# Patient Record
Sex: Male | Born: 1990
Health system: Southern US, Community
[De-identification: ages and names within clinical notes are randomized; demographics above are authoritative.]

## PROBLEM LIST (undated history)

## (undated) DIAGNOSIS — F419 Anxiety disorder, unspecified: Secondary | ICD-10-CM

## (undated) HISTORY — PX: MOUTH SURGERY: SHX715

## (undated) HISTORY — PX: HAND SURGERY: SHX662

## (undated) HISTORY — PX: JOINT REPLACEMENT: SHX530

---

## 2011-04-09 ENCOUNTER — Emergency Department (HOSPITAL_BASED_OUTPATIENT_CLINIC_OR_DEPARTMENT_OTHER)
Admission: EM | Admit: 2011-04-09 | Discharge: 2011-04-09 | Disposition: A | Discharge status: Home / Self Care | Payer: Self-pay | Attending: Emergency Medicine | Admitting: Emergency Medicine

## 2011-04-09 ENCOUNTER — Encounter: Payer: Self-pay | Admitting: *Deleted

## 2011-04-09 DIAGNOSIS — F17200 Nicotine dependence, unspecified, uncomplicated: Secondary | ICD-10-CM | POA: Insufficient documentation

## 2011-04-09 DIAGNOSIS — K029 Dental caries, unspecified: Secondary | ICD-10-CM | POA: Insufficient documentation

## 2011-04-09 DIAGNOSIS — J45909 Unspecified asthma, uncomplicated: Secondary | ICD-10-CM | POA: Insufficient documentation

## 2011-04-09 MED ORDER — CLINDAMYCIN HCL 150 MG PO CAPS
150.0000 mg | ORAL_CAPSULE | Freq: Once | ORAL | Status: AC
  Administered 2011-04-09: 150 mg via ORAL
  Filled 2011-04-09: qty 1

## 2011-04-09 MED ORDER — CEFTRIAXONE SODIUM 1 G IJ SOLR
1.0000 g | Freq: Once | INTRAMUSCULAR | Status: AC
  Administered 2011-04-09: 1 g via INTRAMUSCULAR
  Filled 2011-04-09: qty 10

## 2011-04-09 MED ORDER — OXYCODONE-ACETAMINOPHEN 5-325 MG PO TABS: 2.0000 | ORAL_TABLET | ORAL | Status: AC | PRN

## 2011-04-09 MED ORDER — OXYCODONE-ACETAMINOPHEN 5-325 MG PO TABS
2.0000 | ORAL_TABLET | Freq: Once | ORAL | Status: AC
  Administered 2011-04-09: 2 via ORAL
  Filled 2011-04-09: qty 2

## 2011-04-09 MED ORDER — CLINDAMYCIN HCL 150 MG PO CAPS: 150.0000 mg | ORAL_CAPSULE | Freq: Four times a day (QID) | ORAL | Status: AC

## 2011-04-09 NOTE — ED Provider Notes (Signed)
History     CSN: [id number] Arrival date & time: 04/09/2011  5:20 PM   First MD Initiated Contact with Patient 04/09/11 1737      Chief Complaint  Patient presents with  . Dental Pain    (Consider location/radiation/quality/duration/timing/severity/associated sxs/prior treatment) Patient is a 20 y.o. male presenting with tooth pain and dental injury. The history is provided by the patient. No language interpreter was used.  Dental PainThe primary symptoms include dental injury. The symptoms began more than 1 week ago. The symptoms are worsening. The symptoms are new. The symptoms occur constantly.  Additional symptoms include: dental sensitivity to temperature, gum tenderness, facial swelling and ear pain.   Dental Injury The problem occurs 2 to 4 times per day. The problem has been unchanged. The symptoms are aggravated by nothing. He has tried nothing for the symptoms.    Past Medical History  Diagnosis Date  . Asthma     Past Surgical History  Procedure Date  . Joint replacement     History reviewed. No pertinent family history.  History  Substance Use Topics  . Smoking status: Current Everyday Smoker -- 5 years    Types: Cigarettes  . Smokeless tobacco: Not on file  . Alcohol Use: No      Review of Systems  HENT: Positive for ear pain, facial swelling and dental problem.   All other systems reviewed and are negative.    Allergies  Benadryl allergy  Home Medications   Current Outpatient Rx  Name Route Sig Dispense Refill  . FLUTICASONE-SALMETEROL 100-50 MCG/DOSE IN AEPB Inhalation Inhale 1 puff into the lungs every 12 (twelve) hours.      Marland Kitchen CLINDAMYCIN HCL 150 MG PO CAPS Oral Take 1 capsule (150 mg total) by mouth every 6 (six) hours. 28 capsule 0  . OXYCODONE-ACETAMINOPHEN 5-325 MG PO TABS Oral Take 2 tablets by mouth every 4 (four) hours as needed for pain. 16 tablet 0    BP 110/82  Pulse 69  Temp(Src) 98.2 F (36.8 C) (Oral)  Resp 16  SpO2  100%  Physical Exam  Constitutional: He appears well-developed and well-nourished.  HENT:  Head: Normocephalic and atraumatic.  Right Ear: External ear normal.  Left Ear: External ear normal.  Mouth/Throat: Oropharynx is clear and moist.       Swollen left face,  Swollen lymph nodes  Eyes: Pupils are equal, round, and reactive to light.  Neck: Normal range of motion.  Cardiovascular: Normal rate.   Pulmonary/Chest: Effort normal.  Neurological: He is alert.  Skin: Skin is warm.    ED Course  Procedures (including critical care time)  Labs Reviewed - No data to display No results found.   1. Tooth caries       MDM  Pt referred to dentist for evaluation        Langston Masker, [place] 04/09/11 1836

## 2011-04-09 NOTE — ED Notes (Signed)
Pt c/o left facial swelling and toothache x 4 days

## 2011-04-10 NOTE — ED Provider Notes (Signed)
Medical screening examination/treatment/procedure(s) were performed by non-physician practitioner and as supervising physician I was immediately available for consultation/collaboration.   Tarance Balan A Lain Tetterton, MD 04/10/11 0042 

## 2011-07-31 ENCOUNTER — Emergency Department (HOSPITAL_BASED_OUTPATIENT_CLINIC_OR_DEPARTMENT_OTHER)
Admission: EM | Admit: 2011-07-31 | Discharge: 2011-07-31 | Disposition: A | Discharge status: Home / Self Care | Payer: Self-pay | Attending: Emergency Medicine | Admitting: Emergency Medicine

## 2011-07-31 ENCOUNTER — Encounter (HOSPITAL_BASED_OUTPATIENT_CLINIC_OR_DEPARTMENT_OTHER): Payer: Self-pay | Admitting: *Deleted

## 2011-07-31 DIAGNOSIS — K1120 Sialoadenitis, unspecified: Secondary | ICD-10-CM

## 2011-07-31 DIAGNOSIS — R6884 Jaw pain: Secondary | ICD-10-CM | POA: Insufficient documentation

## 2011-07-31 DIAGNOSIS — R220 Localized swelling, mass and lump, head: Secondary | ICD-10-CM | POA: Insufficient documentation

## 2011-07-31 DIAGNOSIS — J45909 Unspecified asthma, uncomplicated: Secondary | ICD-10-CM | POA: Insufficient documentation

## 2011-07-31 DIAGNOSIS — F17200 Nicotine dependence, unspecified, uncomplicated: Secondary | ICD-10-CM | POA: Insufficient documentation

## 2011-07-31 MED ORDER — IBUPROFEN 800 MG PO TABS
800.0000 mg | ORAL_TABLET | Freq: Once | ORAL | Status: AC
  Administered 2011-07-31: 800 mg via ORAL
  Filled 2011-07-31: qty 1

## 2011-07-31 MED ORDER — AMOXICILLIN-POT CLAVULANATE 875-125 MG PO TABS
1.0000 | ORAL_TABLET | Freq: Once | ORAL | Status: AC
  Administered 2011-07-31: 1 via ORAL
  Filled 2011-07-31: qty 1

## 2011-07-31 MED ORDER — AMOXICILLIN-POT CLAVULANATE 875-125 MG PO TABS: 1.0000 | ORAL_TABLET | Freq: Two times a day (BID) | ORAL | Status: AC

## 2011-07-31 NOTE — ED Provider Notes (Signed)
History     CSN: [id number]  Arrival date & time 07/31/11  1704   First MD Initiated Contact with Patient 07/31/11 1801      Chief Complaint  Patient presents with  . Jaw Pain    (Consider location/radiation/quality/duration/timing/severity/associated sxs/prior treatment) HPI Comments: Patient presents with intermittent left facial swelling over the last few weeks.  He notes that specifically over the last week it is gradually get worse every day.  It is worse in the morning and improves during the course of the day.  It does hurt to chew due to the swelling.  He notes no broken teeth or dental problems.  No specific fevers.  No shortness of breath.  No difficulty swallowing or speaking.  Patient is not concerned with the pain but is concerned about the continued facial swelling.  Patient is a 21 y.o. male presenting with facial injury. The history is provided by the patient. No language interpreter was used.  Facial Injury  The incident occurred more than 2 days ago. Pertinent negatives include no chest pain, no abdominal pain, no nausea, no vomiting, no headaches and no cough.    Past Medical History  Diagnosis Date  . Asthma     Past Surgical History  Procedure Date  . Joint replacement     History reviewed. No pertinent family history.  History  Substance Use Topics  . Smoking status: Current Everyday Smoker -- 5 years    Types: Cigarettes  . Smokeless tobacco: Not on file  . Alcohol Use: No      Review of Systems  Constitutional: Negative.  Negative for fever and chills.  HENT: Positive for facial swelling.   Eyes: Negative.  Negative for discharge and redness.  Respiratory: Negative.  Negative for cough and shortness of breath.   Cardiovascular: Negative.  Negative for chest pain.  Gastrointestinal: Negative.  Negative for nausea, vomiting and abdominal pain.  Genitourinary: Negative.  Negative for hematuria.  Musculoskeletal: Negative.  Negative for back  pain.  Skin: Negative.  Negative for color change and rash.  Neurological: Negative for syncope and headaches.  Hematological: Negative.  Negative for adenopathy.  Psychiatric/Behavioral: Negative.  Negative for confusion.  All other systems reviewed and are negative.    Allergies  Benadryl allergy  Home Medications   Current Outpatient Rx  Name Route Sig Dispense Refill  . FLUTICASONE-SALMETEROL 100-50 MCG/DOSE IN AEPB Inhalation Inhale 1 puff into the lungs every 12 (twelve) hours.        BP 114/67  Pulse 70  Temp(Src) 98.6 F (37 C) (Oral)  Resp 20  Ht 5\' 10"  (1.778 m)  Wt 193 lb (87.544 kg)  BMI 27.69 kg/m2  SpO2 100%  Physical Exam  Nursing note and vitals reviewed. Constitutional: He is oriented to person, place, and time. He appears well-developed and well-nourished.  Non-toxic appearance. He does not have a sickly appearance.  HENT:  Head: Normocephalic and atraumatic.         Teeth all appear intact and the left upper and left lower jaw.  No tenderness palpation of her gums or teeth.  Tongue is not swollen.  Uvula is midline and there is no peritonsillar swelling or exudates.  There is tenderness to palpation over the parotid gland  Eyes: Conjunctivae, EOM and lids are normal. Pupils are equal, round, and reactive to light.  Neck: Trachea normal, normal range of motion and full passive range of motion without pain. Neck supple.  Cardiovascular: Normal rate, regular rhythm and  normal heart sounds.   Pulmonary/Chest: Effort normal and breath sounds normal. No respiratory distress.  Abdominal: Normal appearance. There is no CVA tenderness.  Musculoskeletal: Normal range of motion.  Neurological: He is alert and oriented to person, place, and time. He has normal strength.  Skin: Skin is warm, dry and intact. No rash noted.  Psychiatric: He has a normal mood and affect. His behavior is normal. Judgment and thought content normal.    ED Course  Procedures  (including critical care time)  Labs Reviewed - No data to display No results found.   No diagnosis found.    MDM  Patient clinically appears to have parotitis.  I see no obvious dental cause for infection.  Patient tongue is not swollen and there is no airway compromise.  Patient be placed on Augmentin and has been recommended to suck on sour candies to stimulate the parotid gland.  I've also informed him that he should return if the facial swelling is worsening.  They're also going to schedule a dentist followup for next week just for a recheck on his teeth and the parotid gland infection.        Nat Christen, MD 07/31/11 [phone]

## 2011-07-31 NOTE — ED Notes (Signed)
Patient ambulatory to triage with family.  Patient reports left side facial swelling "on and off" for the past 5 months.  Worse for the past week.

## 2011-07-31 NOTE — Discharge Instructions (Signed)
Parotitis  Parotitis is an inflammation of one or both parotid glands. This is the main salivary gland. It lies behind the angle of the jaw and below the ear lobe. The saliva produced comes out of a tiny opening (duct) inside the cheek on either side. It is usually at the level of the upper back teeth. If the parotid gland is swollen, the ear is pushed up and out in a particular way. This helps set this condition apart from a simple lymph gland infection in the same area.  CAUSES   Cases of mumps have mostly disappeared since the start of immunization against mumps. Currently, the most common causes of parotitis are:   Germ (bacterial) infection.   Inflammation of the lymph channels (lymphatics).  Other Uncommon Causes of Parotitis:   Sjogren's syndrome. A condition in which arthritis is associated with a decrease in activity of the glands of the body that produce saliva and tears. Some people are bothered by a dry mouth and intermittent salivary gland enlargement. The diagnosis is made with blood tests or by examination of a piece of tissue from the inside of the lip.   Atypical mycobacteria. Can give rise to a condition that usually infects children. It is a germ similar to tuberculosis. It is often resistant to antibiotic treatment. It may require surgical treatment and removal of the infected salivary gland.   Actinomycosis. An infection of the parotid gland that may also involve the overlying skin. The diagnosis is made by detecting granules of sulphur, produced by the bacteria, on microscopic examination. Treatment is with a prolonged course of penicillin for up to one year.  Acute (Sudden Onset) Bacertial Parotitis  This is a sudden inflammatory response to bacterial infection that causes:   Redness (erythema).   Pain.   Swelling.   Tenderness over the gland on the side of the cheek.   The appearance of pus from the opening of the duct on the inside of the cheek.  It used to be common in dehydrated  and debilitated patients, and often following surgery. It is now more commonly seen after radiotherapy (X-ray treatment) or in patients with a poorly working immune system. Treatment includes:    Correction of the lack of fluids (rehydration).   Medications which kill germs(antibiotics).   Pain relief.  Chronic Recurrent Parotitis  This refers to repeated episodes of discomfort and swelling of the parotid gland. This occurs often after eating. It is caused by decreased flow of saliva. This is often due to either blockage of the duct by a stone or the formation of a duct narrowing. It is treated with:    Gland massage.   Methods to stimulate the flow of saliva (for example, giving lemon juice).   Antibiotics if required.  Surgery to remove the gland is possible. The benefits of surgery need to be balanced against the risk of damage to the facial nerve. The facial nerve allows the muscles of facial expression to function. Damage to this can cause paralysis of one side of the face. X-ray treatment (radiotherapy) and treatment with steroid tablets have been considered. But they are thought to be ineffective.  Viral Parotitis  The most common viral cause of parotitis is mumps. It usually affects 4 to 10 year olds. It causes painful swelling of both parotid glands.  Recurrent Parotitis in Children  This condition is thought to be due to swelling or ballooning of the ducts (ectasia). It results in the same problems(symptoms ) as acute   bacterial parotitis. It is usually caused by bacteria called streptococci that is treated with penicillin. It usually gets well by itself without treatment (self-limiting). Surgery is usually not needed.  Tuberculosis  The parotid glands may become infected with the same bacteria causing tuberculosis (TB). Treatment is with anti-tuberculous antibiotic therapy.  HOME CARE INSTRUCTIONS    Apply ice bags about every 2 hours, for 15 to 20 minutes, while awake, to the sore gland. Place ice  in a plastic bag with a towel around it to prevent frostbite to skin. Continue for 24 hours and then as directed by your caregiver.   Only take over-the-counter or prescription medicines for pain, discomfort, or fever as directed by your caregiver.  SEEK IMMEDIATE MEDICAL CARE IF:    There is increased pain or swelling in your gland that is not controlled with medication.   You have a fever.  Document Released: 10/12/2001 Document Revised: 04/11/2011 Document Reviewed: 03/18/2011  [employer] Patient Information 2012 [employer].

## 2013-06-07 ENCOUNTER — Encounter (HOSPITAL_BASED_OUTPATIENT_CLINIC_OR_DEPARTMENT_OTHER): Payer: Self-pay | Admitting: Emergency Medicine

## 2013-06-07 ENCOUNTER — Emergency Department (HOSPITAL_BASED_OUTPATIENT_CLINIC_OR_DEPARTMENT_OTHER): Payer: Self-pay

## 2013-06-07 ENCOUNTER — Emergency Department (HOSPITAL_BASED_OUTPATIENT_CLINIC_OR_DEPARTMENT_OTHER)
Admission: EM | Admit: 2013-06-07 | Discharge: 2013-06-07 | Disposition: A | Discharge status: Home / Self Care | Payer: Self-pay | Attending: Emergency Medicine | Admitting: Emergency Medicine

## 2013-06-07 DIAGNOSIS — R079 Chest pain, unspecified: Secondary | ICD-10-CM

## 2013-06-07 DIAGNOSIS — R0789 Other chest pain: Secondary | ICD-10-CM | POA: Insufficient documentation

## 2013-06-07 DIAGNOSIS — M545 Low back pain, unspecified: Secondary | ICD-10-CM | POA: Insufficient documentation

## 2013-06-07 DIAGNOSIS — J45909 Unspecified asthma, uncomplicated: Secondary | ICD-10-CM | POA: Insufficient documentation

## 2013-06-07 DIAGNOSIS — IMO0002 Reserved for concepts with insufficient information to code with codable children: Secondary | ICD-10-CM | POA: Insufficient documentation

## 2013-06-07 DIAGNOSIS — M546 Pain in thoracic spine: Secondary | ICD-10-CM | POA: Insufficient documentation

## 2013-06-07 DIAGNOSIS — G8911 Acute pain due to trauma: Secondary | ICD-10-CM | POA: Insufficient documentation

## 2013-06-07 DIAGNOSIS — F17200 Nicotine dependence, unspecified, uncomplicated: Secondary | ICD-10-CM | POA: Insufficient documentation

## 2013-06-07 MED ORDER — HYDROCODONE-ACETAMINOPHEN 5-325 MG PO TABS
1.0000 | ORAL_TABLET | Freq: Once | ORAL | Status: AC
  Administered 2013-06-07: 1 via ORAL
  Filled 2013-06-07: qty 1

## 2013-06-07 MED ORDER — HYDROCODONE-ACETAMINOPHEN 5-325 MG PO TABS
2.0000 | ORAL_TABLET | ORAL | Status: DC | PRN
Start: 2013-06-07 — End: 2016-02-19

## 2013-06-07 NOTE — ED Notes (Signed)
Patient transported to & from X-ray. 

## 2013-06-07 NOTE — Discharge Instructions (Signed)
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
## 2013-06-07 NOTE — ED Provider Notes (Signed)
Medical screening examination/treatment/procedure(s) were performed by non-physician practitioner and as supervising physician I was immediately available for consultation/collaboration.   Cheikh Bramble L Cassey Hurrell, MD 06/07/13 2259 

## 2013-12-06 ENCOUNTER — Emergency Department (HOSPITAL_BASED_OUTPATIENT_CLINIC_OR_DEPARTMENT_OTHER)
Admission: EM | Admit: 2013-12-06 | Discharge: 2013-12-06 | Disposition: A | Discharge status: Home / Self Care | Payer: Self-pay | Attending: Emergency Medicine | Admitting: Emergency Medicine

## 2013-12-06 ENCOUNTER — Encounter (HOSPITAL_BASED_OUTPATIENT_CLINIC_OR_DEPARTMENT_OTHER): Payer: Self-pay | Admitting: Emergency Medicine

## 2013-12-06 ENCOUNTER — Emergency Department (HOSPITAL_BASED_OUTPATIENT_CLINIC_OR_DEPARTMENT_OTHER): Payer: Self-pay

## 2013-12-06 DIAGNOSIS — F17200 Nicotine dependence, unspecified, uncomplicated: Secondary | ICD-10-CM | POA: Insufficient documentation

## 2013-12-06 DIAGNOSIS — M5410 Radiculopathy, site unspecified: Secondary | ICD-10-CM

## 2013-12-06 DIAGNOSIS — J45909 Unspecified asthma, uncomplicated: Secondary | ICD-10-CM | POA: Insufficient documentation

## 2013-12-06 DIAGNOSIS — R52 Pain, unspecified: Secondary | ICD-10-CM | POA: Insufficient documentation

## 2013-12-06 DIAGNOSIS — M545 Low back pain, unspecified: Secondary | ICD-10-CM | POA: Insufficient documentation

## 2013-12-06 DIAGNOSIS — Z79899 Other long term (current) drug therapy: Secondary | ICD-10-CM | POA: Insufficient documentation

## 2013-12-06 DIAGNOSIS — IMO0002 Reserved for concepts with insufficient information to code with codable children: Secondary | ICD-10-CM | POA: Insufficient documentation

## 2013-12-06 MED ORDER — TRAMADOL HCL 50 MG PO TABS: 50.0000 mg | ORAL_TABLET | Freq: Four times a day (QID) | ORAL | Status: DC | PRN

## 2013-12-06 MED ORDER — KETOROLAC TROMETHAMINE 60 MG/2ML IM SOLN
60.0000 mg | Freq: Once | INTRAMUSCULAR | Status: AC
  Administered 2013-12-06: 60 mg via INTRAMUSCULAR
  Filled 2013-12-06: qty 2

## 2013-12-06 MED ORDER — PREDNISONE 10 MG PO TABS: 20.0000 mg | ORAL_TABLET | Freq: Two times a day (BID) | ORAL | Status: DC

## 2013-12-06 NOTE — Discharge Instructions (Signed)
Prednisone as prescribed. Tramadol as prescribed as needed for pain.  Followup with your primary Dr. if not improving in the next week.   Back Pain, Adult Back pain is very common. The pain often gets better over time. The cause of back pain is usually not dangerous. Most people can learn to manage their back pain on their own.  HOME CARE   Stay active. Start with short walks on flat ground if you can. Try to walk farther each day.  Do not sit, drive, or stand in one place for more than 30 minutes. Do not stay in bed.  Do not avoid exercise or work. Activity can help your back heal faster.  Be careful when you bend or lift an object. Bend at your knees, keep the object close to you, and do not twist.  Sleep on a firm mattress. Lie on your side, and bend your knees. If you lie on your back, put a pillow under your knees.  Only take medicines as told by your doctor.  Put ice on the injured area.  Put ice in a plastic bag.  Place a towel between your skin and the bag.  Leave the ice on for 15-20 minutes, 03-04 times a day for the first 2 to 3 days. After that, you can switch between ice and heat packs.  Ask your doctor about back exercises or massage.  Avoid feeling anxious or stressed. Find good ways to deal with stress, such as exercise. GET HELP RIGHT AWAY IF:   Your pain does not go away with rest or medicine.  Your pain does not go away in 1 week.  You have new problems.  You do not feel well.  The pain spreads into your legs.  You cannot control when you poop (bowel movement) or pee (urinate).  Your arms or legs feel weak or lose feeling (numbness).  You feel sick to your stomach (nauseous) or throw up (vomit).  You have belly (abdominal) pain.  You feel like you may pass out (faint). MAKE SURE YOU:   Understand these instructions.  Will watch your condition.  Will get help right away if you are not doing well or get worse. Document Released: 10/09/2007  Document Revised: 07/15/2011 Document Reviewed: 08/24/2013 [facility] Patient Information 2015 [employer] [place], [place]. This information is not intended to replace advice given to you by your health care provider. Make sure you discuss any questions you have with your health care provider.
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
## 2013-12-06 NOTE — ED Notes (Signed)
Patient transported to X-ray 
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
## 2014-03-07 ENCOUNTER — Emergency Department (HOSPITAL_BASED_OUTPATIENT_CLINIC_OR_DEPARTMENT_OTHER)
Admission: EM | Admit: 2014-03-07 | Discharge: 2014-03-07 | Disposition: A | Discharge status: Home / Self Care | Payer: Self-pay | Attending: Emergency Medicine | Admitting: Emergency Medicine

## 2014-03-07 ENCOUNTER — Encounter (HOSPITAL_BASED_OUTPATIENT_CLINIC_OR_DEPARTMENT_OTHER): Payer: Self-pay | Admitting: *Deleted

## 2014-03-07 ENCOUNTER — Emergency Department (HOSPITAL_BASED_OUTPATIENT_CLINIC_OR_DEPARTMENT_OTHER): Payer: Self-pay

## 2014-03-07 DIAGNOSIS — J45909 Unspecified asthma, uncomplicated: Secondary | ICD-10-CM | POA: Insufficient documentation

## 2014-03-07 DIAGNOSIS — R109 Unspecified abdominal pain: Secondary | ICD-10-CM | POA: Insufficient documentation

## 2014-03-07 DIAGNOSIS — Z79899 Other long term (current) drug therapy: Secondary | ICD-10-CM | POA: Insufficient documentation

## 2014-03-07 DIAGNOSIS — Z7951 Long term (current) use of inhaled steroids: Secondary | ICD-10-CM | POA: Insufficient documentation

## 2014-03-07 DIAGNOSIS — Z7952 Long term (current) use of systemic steroids: Secondary | ICD-10-CM | POA: Insufficient documentation

## 2014-03-07 DIAGNOSIS — M5136 Other intervertebral disc degeneration, lumbar region: Secondary | ICD-10-CM | POA: Insufficient documentation

## 2014-03-07 DIAGNOSIS — Z720 Tobacco use: Secondary | ICD-10-CM | POA: Insufficient documentation

## 2014-03-07 DIAGNOSIS — Z9660 Presence of unspecified orthopedic joint implant: Secondary | ICD-10-CM | POA: Insufficient documentation

## 2014-03-07 LAB — URINALYSIS, ROUTINE W REFLEX MICROSCOPIC
Bilirubin Urine: NEGATIVE
Glucose, UA: NEGATIVE mg/dL
Hgb urine dipstick: NEGATIVE
KETONES UR: NEGATIVE mg/dL
LEUKOCYTES UA: NEGATIVE
NITRITE: NEGATIVE
PH: 7 (ref 5.0–8.0)
Protein, ur: NEGATIVE mg/dL
SPECIFIC GRAVITY, URINE: 1.016 (ref 1.005–1.030)
Urobilinogen, UA: 1 mg/dL (ref 0.0–1.0)

## 2014-03-07 LAB — BASIC METABOLIC PANEL
ANION GAP: 13 (ref 5–15)
BUN: 12 mg/dL (ref 6–23)
CHLORIDE: 104 meq/L (ref 96–112)
CO2: 24 mEq/L (ref 19–32)
CREATININE: 1 mg/dL (ref 0.50–1.35)
Calcium: 9.6 mg/dL (ref 8.4–10.5)
GFR calc non Af Amer: >90 mL/min (ref >90)
Glucose, Bld: 87 mg/dL (ref 70–99)
Potassium: 4.2 mEq/L (ref 3.7–5.3)
Sodium: 141 mEq/L (ref 137–147)

## 2014-03-07 MED ORDER — IBUPROFEN 800 MG PO TABS: 800.0000 mg | ORAL_TABLET | Freq: Three times a day (TID) | ORAL | Status: DC

## 2014-03-07 MED ORDER — ONDANSETRON HCL 4 MG/2ML IJ SOLN
4.0000 mg | Freq: Once | INTRAMUSCULAR | Status: AC
  Administered 2014-03-07: 4 mg via INTRAVENOUS
  Filled 2014-03-07: qty 2

## 2014-03-07 MED ORDER — HYDROMORPHONE HCL 1 MG/ML IJ SOLN
1.0000 mg | Freq: Once | INTRAMUSCULAR | Status: AC
  Administered 2014-03-07: 1 mg via INTRAVENOUS
  Filled 2014-03-07: qty 1

## 2014-03-07 MED ORDER — TRAMADOL HCL 50 MG PO TABS: 50.0000 mg | ORAL_TABLET | Freq: Four times a day (QID) | ORAL | Status: DC | PRN

## 2014-03-07 MED ORDER — KETOROLAC TROMETHAMINE 30 MG/ML IJ SOLN
30.0000 mg | Freq: Once | INTRAMUSCULAR | Status: AC
  Administered 2014-03-07: 30 mg via INTRAVENOUS
  Filled 2014-03-07: qty 1

## 2014-03-07 NOTE — ED Provider Notes (Signed)
CSN: [id number]     Arrival date & time 03/07/14  1037 History   First MD Initiated Contact with Patient 03/07/14 1042     Chief Complaint  Patient presents with  . Flank Pain     (Consider location/radiation/quality/duration/timing/severity/associated sxs/prior Treatment) HPI Comments: Pt c/o left flank pain that started yesterday. States that the pain is now coming around the front. No dysuria, fever, vomiting, or diarrhea. States the  Pain worsened this morning. No numbness, weakness. No injury. No history of similar symptoms. States that pain has become constant.  The history is provided by the patient. No language interpreter was used.    Past Medical History  Diagnosis Date  . Asthma    Past Surgical History  Procedure Laterality Date  . Joint replacement    . Mouth surgery    . Hand surgery     No family history on file. History  Substance Use Topics  . Smoking status: Current Every Day Smoker -- 5 years    Types: Cigarettes  . Smokeless tobacco: Not on file  . Alcohol Use: Yes     Comment: occasional    Review of Systems  All other systems reviewed and are negative.     Allergies  Diphenhydramine hcl  Home Medications   Prior to Admission medications   Medication Sig Start Date End Date Taking? Authorizing Provider  ALBUTEROL IN Inhale into the lungs.   Yes Historical Provider, MD  Fluticasone-Salmeterol (ADVAIR) 100-50 MCG/DOSE AEPB Inhale 1 puff into the lungs every 12 (twelve) hours.     Yes Historical Provider, MD  HYDROcodone-acetaminophen (NORCO/VICODIN) 5-325 MG per tablet Take 2 tablets by mouth every 4 (four) hours as needed. 06/07/13   Teressa LowerVrinda Nashira Mcglynn, NP  predniSONE (DELTASONE) 10 MG tablet Take 2 tablets (20 mg total) by mouth 2 (two) times daily. 12/06/13   Geoffery Lyonsouglas Delo, MD  traMADol (ULTRAM) 50 MG tablet Take 1 tablet (50 mg total) by mouth every 6 (six) hours as needed. 12/06/13   Geoffery Lyonsouglas Delo, MD   BP 131/73 mmHg  Pulse 80  Temp(Src) 98.6 F  (37 C) (Oral)  Resp 20  SpO2 100% Physical Exam  Constitutional: He is oriented to person, place, and time. He appears well-developed and well-nourished.  HENT:  Head: Normocephalic and atraumatic.  Cardiovascular: Normal rate and regular rhythm.   Pulmonary/Chest: Effort normal and breath sounds normal.  Abdominal: Soft. Bowel sounds are normal. There is no tenderness.  Musculoskeletal: Normal range of motion. He exhibits no tenderness.  Neurological: He is alert and oriented to person, place, and time.  Skin: Skin is warm and dry.  Psychiatric: He has a normal mood and affect.  Nursing note and vitals reviewed.   ED Course  Procedures (including critical care time) Labs Review Labs Reviewed  URINALYSIS, ROUTINE W REFLEX MICROSCOPIC  BASIC METABOLIC PANEL    Imaging Review Ct Renal Stone Study  03/07/2014   CLINICAL DATA:  Left back and flank pain since last night.  EXAM: CT ABDOMEN AND PELVIS WITHOUT CONTRAST  TECHNIQUE: Multidetector CT imaging of the abdomen and pelvis was performed following the standard protocol without IV contrast.  COMPARISON:  None.  FINDINGS: Lung bases are clear.  No effusions.  Heart is normal size.  Liver, spleen, pancreas, adrenals and kidneys have an unremarkable unenhanced appearance. Gallbladder is contracted. No renal or ureteral stones. No hydronephrosis. Urinary bladder is unremarkable.  Appendix is visualized and is normal. Stomach, large and small bowel are unremarkable. No free fluid,  free air or adenopathy. Aorta is normal caliber.  Early degenerative disc disease changes at L5-S1 with disc space narrowing and vacuum disc.  IMPRESSION: No evidence of renal or ureteral stones.  No hydronephrosis.  Early degenerative disc disease at L5-S1.   Electronically Signed   By: Charlett NoseKevin  Dover M.D.   On: 03/07/2014 11:47     EKG Interpretation None      MDM   Final diagnoses:  Flank pain  Degenerative disc disease, lumbar    Pt got no relief with  dilaudid. Will give a dose of toradol:pt given ibuprofen and toradol for pain and follow up with Dr. Pearletha Forgehudnall and degenerative disk    Teressa LowerVrinda Hula Tasso, NP 03/07/14 1212

## 2014-03-07 NOTE — ED Notes (Signed)
Patient transported to CT 

## 2014-03-07 NOTE — ED Notes (Signed)
Reports left side back/flank pain since last night- reports pain with breath- walks independently with slow steady gait

## 2014-11-13 ENCOUNTER — Encounter (HOSPITAL_BASED_OUTPATIENT_CLINIC_OR_DEPARTMENT_OTHER): Payer: Self-pay | Admitting: Emergency Medicine

## 2014-11-13 ENCOUNTER — Emergency Department (HOSPITAL_BASED_OUTPATIENT_CLINIC_OR_DEPARTMENT_OTHER)
Admission: EM | Admit: 2014-11-13 | Discharge: 2014-11-13 | Disposition: A | Discharge status: Home / Self Care | Payer: Self-pay | Attending: Emergency Medicine | Admitting: Emergency Medicine

## 2014-11-13 ENCOUNTER — Emergency Department (HOSPITAL_BASED_OUTPATIENT_CLINIC_OR_DEPARTMENT_OTHER): Payer: Self-pay

## 2014-11-13 DIAGNOSIS — Z791 Long term (current) use of non-steroidal anti-inflammatories (NSAID): Secondary | ICD-10-CM | POA: Insufficient documentation

## 2014-11-13 DIAGNOSIS — J069 Acute upper respiratory infection, unspecified: Secondary | ICD-10-CM | POA: Insufficient documentation

## 2014-11-13 DIAGNOSIS — Z720 Tobacco use: Secondary | ICD-10-CM | POA: Insufficient documentation

## 2014-11-13 DIAGNOSIS — J45909 Unspecified asthma, uncomplicated: Secondary | ICD-10-CM | POA: Insufficient documentation

## 2014-11-13 DIAGNOSIS — R110 Nausea: Secondary | ICD-10-CM | POA: Insufficient documentation

## 2014-11-13 DIAGNOSIS — R0781 Pleurodynia: Secondary | ICD-10-CM | POA: Insufficient documentation

## 2014-11-13 DIAGNOSIS — Z7952 Long term (current) use of systemic steroids: Secondary | ICD-10-CM | POA: Insufficient documentation

## 2014-11-13 LAB — RAPID STREP SCREEN (MED CTR MEBANE ONLY): STREPTOCOCCUS, GROUP A SCREEN (DIRECT): NEGATIVE

## 2014-11-13 MED ORDER — IBUPROFEN 800 MG PO TABS
800.0000 mg | ORAL_TABLET | Freq: Once | ORAL | Status: AC
  Administered 2014-11-13: 800 mg via ORAL
  Filled 2014-11-13: qty 1

## 2014-11-13 NOTE — ED Notes (Signed)
MD at bedside. 

## 2014-11-13 NOTE — ED Provider Notes (Signed)
CSN: [id number]     Arrival date & time 11/13/14  1539 History   First MD Initiated Contact with Patient 11/13/14 1737     Chief Complaint  Patient presents with  . Sore Throat  . Cough     (Consider location/radiation/quality/duration/timing/severity/associated sxs/prior Treatment) HPI   Blood pressure 126/67, pulse 76, temperature 98.3 F (36.8 C), temperature source Oral, resp. rate 18, SpO2 99 %.  Allean FoundMichael Manders is a 24 y.o. male with past medical history significant for asthma and tobacco use complaining of sore throat, productive cough, pleuritic chest pain and nausea with no vomiting onset 2-3 days ago. Patient denies shortness of breath, wheezing, fever, chills, vomiting, abdominal pain, sick contacts, change in bowel or bladder habits. Patient also notes that he has a pimple on his chin which he thinks may be the cause of his throat discomfort. No medication taken prior to arrival, patient is not using his inhaler anymore frequently than normal.  Past Medical History  Diagnosis Date  . Asthma    Past Surgical History  Procedure Laterality Date  . Joint replacement    . Mouth surgery    . Hand surgery     History reviewed. No pertinent family history. History  Substance Use Topics  . Smoking status: Current Every Day Smoker -- 5 years    Types: Cigarettes  . Smokeless tobacco: Not on file  . Alcohol Use: Yes     Comment: occasional    Review of Systems  10 systems reviewed and found to be negative, except as noted in the HPI.   Allergies  Diphenhydramine hcl  Home Medications   Prior to Admission medications   Medication Sig Start Date End Date Taking? Authorizing Provider  ALBUTEROL IN Inhale into the lungs.    Historical Provider, MD  Fluticasone-Salmeterol (ADVAIR) 100-50 MCG/DOSE AEPB Inhale 1 puff into the lungs every 12 (twelve) hours.      Historical Provider, MD  HYDROcodone-acetaminophen (NORCO/VICODIN) 5-325 MG per tablet Take 2 tablets by mouth  every 4 (four) hours as needed. 06/07/13   Teressa LowerVrinda Pickering, NP  ibuprofen (ADVIL,MOTRIN) 800 MG tablet Take 1 tablet (800 mg total) by mouth 3 (three) times daily. 03/07/14   Teressa LowerVrinda Pickering, NP  predniSONE (DELTASONE) 10 MG tablet Take 2 tablets (20 mg total) by mouth 2 (two) times daily. 12/06/13   Geoffery Lyonsouglas Delo, MD  traMADol (ULTRAM) 50 MG tablet Take 1 tablet (50 mg total) by mouth every 6 (six) hours as needed. 03/07/14   Teressa LowerVrinda Pickering, NP   BP 126/67 mmHg  Pulse 76  Temp(Src) 98.3 F (36.8 C) (Oral)  Resp 18  SpO2 99% Physical Exam  Constitutional: He is oriented to person, place, and time. He appears well-developed and well-nourished. No distress.  HENT:  Head: Normocephalic.  Mouth/Throat: Oropharynx is clear and moist.  Eyes: Conjunctivae are normal. Pupils are equal, round, and reactive to light.  Neck: Normal range of motion. No JVD present. No tracheal deviation present.  Cardiovascular: Normal rate, regular rhythm and intact distal pulses.   Radial pulse equal bilaterally  Pulmonary/Chest: Effort normal and breath sounds normal. No stridor. No respiratory distress. He has no wheezes. He has no rales. He exhibits no tenderness.  No stridor or drooling. No posterior pharynx edema, lip or tongue swelling. Pt reclining comfortably, speaking in complete sentences.   No Wheezing, excellent air movement in all fields.     Abdominal: Soft. Bowel sounds are normal. He exhibits no distension and no mass. There is  no tenderness. There is no rebound and no guarding.  Musculoskeletal: Normal range of motion. He exhibits no edema or tenderness.  No calf asymmetry, superficial collaterals, palpable cords, edema, Homans sign negative bilaterally.    Neurological: He is alert and oriented to person, place, and time.  Skin: Skin is warm. He is not diaphoretic.  1 cm pimple to chin.  Psychiatric: He has a normal mood and affect.  Nursing note and vitals reviewed.   ED Course  Procedures  (including critical care time) Labs Review Labs Reviewed  RAPID STREP SCREEN (NOT AT [facility])  CULTURE, GROUP A STREP    Imaging Review Dg Chest 2 View  11/13/2014   CLINICAL DATA:  Patient will sore throat chest pain and cough. History of asthma.  EXAM: CHEST  2 VIEW  COMPARISON:  Chest radiograph 06/07/2013  FINDINGS: Normal cardiac and mediastinal contours. No consolidative pulmonary opacities. No pleural effusion or pneumothorax. Regional skeleton is unremarkable.  IMPRESSION: No acute cardiopulmonary process.   Electronically Signed   By: Annia Belt M.D.   On: 11/13/2014 16:12     EKG Interpretation None      MDM   Final diagnoses:  URI (upper respiratory infection)    Filed Vitals:   11/13/14 1547  BP: 126/67  Pulse: 76  Temp: 98.3 F (36.8 C)  TempSrc: Oral  Resp: 18  SpO2: 99%    Medications  ibuprofen (ADVIL,MOTRIN) tablet 800 mg (not administered)    Bonifacio Pruden is a pleasant 24 y.o. male presenting with sore throat productive cough and pleuritic chest pain worsening over 3 days. Sounds are clear to auscultation with excellent air movement in all fields, no tachycardia, hypoxia. Chest x-rays without infiltrate. Rapid strep is negative and posterior pharynx although slightly erythematous with no significant abnormalities. Reassured patient this is likely a viral upper respiratory infection, encouraged him to push fluids and take Motrin for the musculoskeletal discomfort from coughing.  Evaluation does not show pathology that would require ongoing emergent intervention or inpatient treatment. Pt is hemodynamically stable and mentating appropriately. Discussed findings and plan with patient/guardian, who agrees with care plan. All questions answered. Return precautions discussed and outpatient follow up given.    Wynetta Emery, PA-C 11/13/14 1758  Joni Reining Waymon Laser, PA-C 11/13/14 1818  Margarita Grizzle, MD 11/14/14 1511

## 2014-11-13 NOTE — ED Notes (Signed)
Pt in c/o productive cough and sore throat x 3 days.

## 2014-11-13 NOTE — ED Notes (Signed)
Discharge instructions given and reviewed with patient.  Patient verbalized understanding to use OTC medications for symptoms and to follow up with PMD.  Patient ambulatory; discharged home in good condition.

## 2014-11-13 NOTE — Discharge Instructions (Signed)
For pain control please take ibuprofen (also known as Motrin or Advil) 800mg  (this is normally 4 over the counter pills) 3 times a day  for 5 days. Take with food to minimize stomach irritation.  Do not hesitate to return to the emergency room for any new, worsening or concerning symptoms.  Please obtain primary care using resource guide below. Let them know that you were seen in the emergency room and that they will need to obtain records for further outpatient management.   Upper Respiratory Infection, Adult An upper respiratory infection (URI) is also known as the common cold. It is often caused by a type of germ (virus). Colds are easily spread (contagious). You can pass it to others by kissing, coughing, sneezing, or drinking out of the same glass. Usually, you get better in 1 or 2 weeks.  HOME CARE   Only take medicine as told by your doctor.  Use a warm mist humidifier or breathe in steam from a hot shower.  Drink enough water and fluids to keep your pee (urine) clear or pale yellow.  Get plenty of rest.  Return to work when your temperature is back to normal or as told by your doctor. You may use a face mask and wash your hands to stop your cold from spreading. GET HELP RIGHT AWAY IF:   After the first few days, you feel you are getting worse.  You have questions about your medicine.  You have chills, shortness of breath, or brown or red spit (mucus).  You have yellow or brown snot (nasal discharge) or pain in the face, especially when you bend forward.  You have a fever, puffy (swollen) neck, pain when you swallow, or white spots in the back of your throat.  You have a bad headache, ear pain, sinus pain, or chest pain.  You have a high-pitched whistling sound when you breathe in and out (wheezing).  You have a lasting cough or cough up blood.  You have sore muscles or a stiff neck. MAKE SURE YOU:   Understand these instructions.  Will watch your condition.  Will  get help right away if you are not doing well or get worse. Document Released: 10/09/2007 Document Revised: 07/15/2011 Document Reviewed: 07/28/2013 [facility] Patient Information 2015 [place], [place]. This information is not intended to replace advice given to you by your health care provider. Make sure you discuss any questions you have with your health care provider.   Emergency Department Resource Guide 1) Find a Doctor and Pay Out of Pocket Although you won't have to find out who is covered by your insurance plan, it is a good idea to ask around and get recommendations. You will then need to call the office and see if the doctor you have chosen will accept you as a new patient and what types of options they offer for patients who are self-pay. Some doctors offer discounts or will set up payment plans for their patients who do not have insurance, but you will need to ask so you aren't surprised when you get to your appointment.  2) Contact Your Local Health Department Not all health departments have doctors that can see patients for sick visits, but many do, so it is worth a call to see if yours does. If you don't know where your local health department is, you can check in your phone book. The CDC also has a tool to help you locate your state's health department, and many state websites also  have listings of all of their local health departments.  3) Find a Walk-in Clinic If your illness is not likely to be very severe or complicated, you may want to try a walk in clinic. These are popping up all over the country in pharmacies, drugstores, and shopping centers. They're usually staffed by nurse practitioners or physician assistants that have been trained to treat common illnesses and complaints. They're usually fairly quick and inexpensive. However, if you have serious medical issues or chronic medical problems, these are probably not your best option.  No Primary Care Doctor: - Call Health Connect  at  [phone] - they can help you locate a primary care doctor that  accepts your insurance, provides certain services, etc. - Physician Referral Service- [phone]  Chronic Pain Problems: Organization         Address  Phone   Notes  [facility]  [phone]) [phone] Patients need to be referred by their primary care doctor.   Medication Assistance: Organization         Address  Phone   Notes  [facility] Medication [facility] [address] [place], [place] [address] [phone]) [phone] --Must be a resident of [place] [facility] -- Must have NO insurance coverage whatsoever (no Medicaid/ Medicare, etc.) -- The pt. MUST have a primary care doctor that directs their care regularly and follows them in the community   MedAssist  [phone]   [employer]  [phone]    Agencies that provide inexpensive medical care: Organization         Address  Phone   Notes  [facility]  [phone]   [facility]    [phone]   [facility] [address] [place], [place] [address] [phone]   [facility] [address], [place] [phone]   Planned Parenthood    [phone]) [phone]   [facility]    [phone]) [phone]   [facility]  [address], [place] Phone:  [phone], Fax:  [phone] Hours of Operation:  9 am - 6 pm, M-F.  Also accepts Medicaid/Medicare and self-pay.  [facility]  [address], [place] Phone: [phone]) [phone], Fax: [phone]. Hours of Operation:  8:30 am - 5:30 pm, M-F.  Also accepts Medicaid and self-pay.  [facility] [place] [address], [place] Phone: [phone]) [phone]   [facility] [address], [place], [place] [phone], Ext. [phone] Mondays & Thursdays: 7-9 AM.  First 15 patients are seen on a first come, first serve basis.     Medicaid-accepting [facility] Providers:  Organization         Address  Phone   Notes  [facility] [address], [place] [phone] Also accepts self-pay patients.  [facility] [address], [place]  [phone]) [phone]   [facility] [address], [place] [phone]   [facility] [address] [place] [address], [place] [phone]) [phone]   Renaye RakersVeita Bland [address], [place]   [phone] Only accepts [place] Access [place] patients after they have their name applied to their card.   Self-Pay (no insurance) in [facility]:  [employer]ization         Address  Phone  Notes  Sickle Cell Patients, [facility] [address], [place] [phone]) [phone]   [facility] [address], [place] [phone]) [phone]   [facility] [place]  [address], [place] [phone]   [facility]/Dr. Osei-Bonsu  [address] [place] [address], [place] or [phone], [place] [phone]) [phone] Phone number for both [place] and [place] locations is the same.  [facility]gent [facility] and [facility] [address], [place] [phone]   [facility] [place] [address] [place] [address], [place] or [address] [phone]) [phone]   [facility] [address], [place] [phone], phone; [phone]) [phone], fax Sees patients 1st and 3rd Saturday of every month.  Must not qualify for public or private insurance (i.e. Medicaid, Medicare, [place] Health Choice, Veterans' Benefits)  Household income should be no more than 200% of the poverty level The clinic cannot treat you if you are pregnant or think you are pregnant  Sexually transmitted diseases are not treated at the clinic.    Dental Care: Organization         Address  Phone  Notes  [facility]  Department of [facility] [address], [place] [phone]) [phone] Accepts children up to age 26 who are enrolled in [place] or [place] Health Choice; pregnant women with a Medicaid card; and children who have applied for Medicaid or [place] Health Choice, but were declined, whose parents can pay a reduced fee at time of service.  [facility] Department of [facility] [place]  [address], [place] [phone]) [phone] Accepts children up to age 26 who are enrolled in [place] or [place] Health Choice; pregnant women with a Medicaid card; and children who have applied for Medicaid or [place] Health Choice, but were declined, whose parents can pay a reduced fee at time of service.  [facility]  [address], [place] [phone] Patients are seen by appointment only. Walk-ins are not accepted. [facility] will see patients 26 years of age and older. Monday - Tuesday (8am-5pm) Most Wednesdays (8:30-5pm) $30 per visit, cash only  [facility]  [address], [facility] [phone] Patients are seen by appointment only. Walk-ins are not accepted. [facility] will see patients 26 years of age and older. One Wednesday Evening (Monthly: Volunteer Based).  $30 per visit, cash only  [employer]  [phone]) [phone] for adults; Children under age 574, call Graduate Pediatric Dentistry at [phone]. Children aged 624-14, please call [phone]) [phone] to request a pediatric application.  Dental services are provided in all areas of dental care including fillings, crowns and bridges, complete and partial dentures, implants, gum treatment, root canals, and extractions. Preventive care is also provided. Treatment is provided to both adults and children. Patients are selected via a lottery and there is often a waiting list.   [facility] [address], [place]  [phone]) [phone]  [email or web address]   [facility] [address], [place], [place] [phone], Ext. [phone] Second and Fourth Thursday of each month, opens at 6:30 AM; Clinic ends at 9 AM.  Patients are seen on a first-come first-served basis, and a limited number are seen during each clinic.   [facility]  [address], [place], [place] [phone]) [phone]  Eligibility Requirements You must have lived in [place], [place], or [place] counties for at least the last three months.   You cannot be eligible for state or federal sponsored [place], including [employer], [place], or [employer].   You generally cannot be eligible for healthcare insurance through your employer.    How to apply: Eligibility screenings are held every Tuesday and Wednesday afternoon from 1:00 pm until 4:00 pm. You do not need an appointment for the interview!  [facility] [address], [place], [place] [phone]   [facility] Health Department  [phone]   [facility] Health Department  [phone]   [facility] Health Department  [phone]    Behavioral Health Resources in the Community: Intensive Outpatient Programs Organization         Address  Phone  Notes  [facility] [address] [place], [place] [phone]   [facility]patient [address], [place], [place] [phone]   [facility] [address] [place], [place]  [phone]   [facility] Mental Health [address]  [place], [place] [phone] or [phone]   Substance Abuse Resources Organization         Address  Phone  Notes  Alcohol [facility] Drug Services  [phone]   [facility]  [phone]   [facility]  [phone]   Floydene Flock  [phone]   Residential & Outpatient Substance Abuse Program  5124723082   Psychological Services Organization          Address  Phone  Notes  [facility]  [phone]   [facility] Services  [phone]   [facility] Mental Health [address] [place] [phone] or [phone]    Mobile Crisis Teams Organization         Address  Phone  Notes  [facility], Mobile Crisis Care Unit  [phone]   [facility]  [address] [place], [place] [phone]   Doristine Locks [address] [place] [phone]    Self-Help/Support Groups Organization         Address  Phone             Notes  [facility]oc[facility] of [place] - variety of support groups  336- I7437963 Call for more information  Narcotics Anonymous (NA), Caring [facility] [address], [employer] [place]  2 meetings at this location   Statistician         Address  Phone  Notes  [facility] [address],    [place]  [phone]   [facility]  [address], [place] [phone], [place], [place] [phone]   [facility] [address] [place], [place] [phone] Admissions: 8am-3pm M-F  [facility] [address]    [place], [place] [phone]   [facility] [address]s [place], [place] [phone]   [facility] [address]  [place], [place] [phone]   [facility] - Intensive Outpatient [address], [place], [place] [phone]   [facility] [address]  [place], [place] [phone] or [phone]   [facility] ([facility]) [address], [place], [place] [phone] Accepts Medicaid  [facility] [address]  [place] [phone] Substance Abuse/Addiction Treatment   [facility] Resources Organization  Address  Phone  Notes  [facility]  [phone]   Angie Fava, PhD [address] Knack [place], [place]   [phone] or [phone]   [facility] Behavioral   [address] [place], [place] [phone]   [facility] [address] [place], [place] [phone] Insurance/Medicaid/sponsorship through [facility] [place] and Families [address]                                    [place], [place] [phone] Therapy/tele-psych/case  [facility] [address], [place] [phone]    Dr. Lolly Mustache  [phone]   [facility] [place]  United [employer] [place] Dept. 1) [address] [place] 2) [address], [place] 3)  [address], [place] [phone]  [phone]   [facility] Child Abuse Hotline 7272838253 or 8302794425 (After Hours)

## 2014-11-16 LAB — CULTURE, GROUP A STREP: Strep A Culture: NEGATIVE

## 2016-01-23 ENCOUNTER — Emergency Department (HOSPITAL_BASED_OUTPATIENT_CLINIC_OR_DEPARTMENT_OTHER): Payer: Self-pay

## 2016-01-23 ENCOUNTER — Emergency Department (HOSPITAL_BASED_OUTPATIENT_CLINIC_OR_DEPARTMENT_OTHER)
Admission: EM | Admit: 2016-01-23 | Discharge: 2016-01-24 | Disposition: A | Discharge status: Home / Self Care | Payer: Self-pay | Attending: Emergency Medicine | Admitting: Emergency Medicine

## 2016-01-23 ENCOUNTER — Encounter (HOSPITAL_BASED_OUTPATIENT_CLINIC_OR_DEPARTMENT_OTHER): Payer: Self-pay | Admitting: *Deleted

## 2016-01-23 DIAGNOSIS — F17210 Nicotine dependence, cigarettes, uncomplicated: Secondary | ICD-10-CM | POA: Insufficient documentation

## 2016-01-23 DIAGNOSIS — Z7951 Long term (current) use of inhaled steroids: Secondary | ICD-10-CM | POA: Insufficient documentation

## 2016-01-23 DIAGNOSIS — R0602 Shortness of breath: Secondary | ICD-10-CM

## 2016-01-23 DIAGNOSIS — J45901 Unspecified asthma with (acute) exacerbation: Secondary | ICD-10-CM | POA: Insufficient documentation

## 2016-01-23 LAB — CBC WITH DIFFERENTIAL/PLATELET
BASOS ABS: 0 10*3/uL (ref 0.0–0.1)
BASOS PCT: 0 %
EOS PCT: 6 %
Eosinophils Absolute: 0.6 10*3/uL (ref 0.0–0.7)
HEMATOCRIT: 42.1 % (ref 39.0–52.0)
Hemoglobin: 14.8 g/dL (ref 13.0–17.0)
Lymphocytes Relative: 26 %
Lymphs Abs: 2.4 10*3/uL (ref 0.7–4.0)
MCH: 28.4 pg (ref 26.0–34.0)
MCHC: 35.2 g/dL (ref 30.0–36.0)
MCV: 80.8 fL (ref 78.0–100.0)
MONO ABS: 0.4 10*3/uL (ref 0.1–1.0)
Monocytes Relative: 5 %
NEUTROS ABS: 5.7 10*3/uL (ref 1.7–7.7)
Neutrophils Relative %: 63 %
PLATELETS: 226 10*3/uL (ref 150–400)
RBC: 5.21 MIL/uL (ref 4.22–5.81)
RDW: 13.9 % (ref 11.5–15.5)
WBC: 9.1 10*3/uL (ref 4.0–10.5)

## 2016-01-23 LAB — BASIC METABOLIC PANEL
ANION GAP: 7 (ref 5–15)
BUN: 12 mg/dL (ref 6–20)
CALCIUM: 9.4 mg/dL (ref 8.9–10.3)
CO2: 23 mmol/L (ref 22–32)
Chloride: 108 mmol/L (ref 101–111)
Creatinine, Ser: 1 mg/dL (ref 0.61–1.24)
Glucose, Bld: 131 mg/dL — ABNORMAL HIGH (ref 65–99)
POTASSIUM: 3.7 mmol/L (ref 3.5–5.1)
Sodium: 138 mmol/L (ref 135–145)

## 2016-01-23 MED ORDER — ALBUTEROL (5 MG/ML) CONTINUOUS INHALATION SOLN
20.0000 mg/h | INHALATION_SOLUTION | RESPIRATORY_TRACT | Status: DC
  Administered 2016-01-23: 20 mg/h via RESPIRATORY_TRACT
  Filled 2016-01-23: qty 20

## 2016-01-23 MED ORDER — MAGNESIUM SULFATE 2 GM/50ML IV SOLN
2.0000 g | Freq: Once | INTRAVENOUS | Status: AC
  Administered 2016-01-23: 2 g via INTRAVENOUS
  Filled 2016-01-23: qty 50

## 2016-01-23 MED ORDER — ALBUTEROL (5 MG/ML) CONTINUOUS INHALATION SOLN
15.0000 mg/h | INHALATION_SOLUTION | RESPIRATORY_TRACT | Status: AC
  Administered 2016-01-23: 15 mg/h via RESPIRATORY_TRACT

## 2016-01-23 MED ORDER — METHYLPREDNISOLONE SODIUM SUCC 125 MG IJ SOLR
125.0000 mg | Freq: Once | INTRAMUSCULAR | Status: AC
  Administered 2016-01-23: 125 mg via INTRAVENOUS
  Filled 2016-01-23: qty 2

## 2016-01-23 NOTE — ED Triage Notes (Signed)
Pt c/o SOB x 3 days HX asthma,  meds at home w/o relief

## 2016-01-23 NOTE — ED Notes (Signed)
Per physician order ambulated patient via pulse ox. Patient able to walk without difficulty stable gait no distress noted. Patient saturations maintained between 95%-98% with heart rate of 112-117. Patient returned to room. Patient tolerated well.

## 2016-01-23 NOTE — ED Notes (Signed)
Pt given warm blanket. Visitor given ice and drink per request.

## 2016-01-23 NOTE — ED Provider Notes (Signed)
[facility] Provider Note   CSN: [id number] Arrival date & time: 01/23/16  2117  By signing my name below, I, Modena Jansky, attest that this documentation has been prepared under the direction and in the presence of Derwood Kaplan, MD . Electronically Signed: Modena Jansky, Scribe. 01/23/2016. 9:41 PM.  History   Chief Complaint Chief Complaint  Patient presents with  . Shortness of Breath   The history is provided by the patient. No language interpreter was used.   HPI Comments: Matthew Cooley is a 25 y.o. male with a hx of chronic asthma who presents to the Emergency Department complaining of intermittent moderate SOB that started 3 days ago. Pt states the SOB has been worsening with associated difficulty breathing 2-3 hours ago and chest pain from heavy breathing. Reports taking 2 nebulizer and 2 inhaler treatments PTA without relief. Denies any hx of chronic cardiac problems, recent hx of smoking, or breathing tube placement. Also denies any cough, fever, rhinorrhea, congestion, or water eyes.   Past Medical History:  Diagnosis Date  . Asthma     There are no active problems to display for this patient.   Past Surgical History:  Procedure Laterality Date  . HAND SURGERY    . JOINT REPLACEMENT    . MOUTH SURGERY         Home Medications    Prior to Admission medications   Medication Sig Start Date End Date Taking? Authorizing Provider  albuterol (PROVENTIL) (5 MG/ML) 0.5% nebulizer solution Take 2.5 mg by nebulization every 6 (six) hours as needed for wheezing or shortness of breath.   Yes Historical Provider, MD  ALBUTEROL IN Inhale into the lungs.    Historical Provider, MD  Fluticasone-Salmeterol (ADVAIR) 100-50 MCG/DOSE AEPB Inhale 1 puff into the lungs every 12 (twelve) hours.      Historical Provider, MD  HYDROcodone-acetaminophen (NORCO/VICODIN) 5-325 MG per tablet Take 2 tablets by mouth every 4 (four) hours as needed. 06/07/13   Teressa Lower,  NP  ibuprofen (ADVIL,MOTRIN) 800 MG tablet Take 1 tablet (800 mg total) by mouth 3 (three) times daily. 03/07/14   Teressa Lower, NP  predniSONE (DELTASONE) 10 MG tablet Take 6 tablets (60 mg total) by mouth daily. 01/24/16   Derwood Kaplan, MD  traMADol (ULTRAM) 50 MG tablet Take 1 tablet (50 mg total) by mouth every 6 (six) hours as needed. 03/07/14   Teressa Lower, NP    Family History History reviewed. No pertinent family history.  Social History Social History  Substance Use Topics  . Smoking status: Current Every Day Smoker    Years: 5.00    Types: Cigarettes  . Smokeless tobacco: Not on file  . Alcohol use Yes     Comment: occasional     Allergies   Diphenhydramine hcl   Review of Systems Review of Systems 10 Systems reviewed and all are negative for acute change except as noted in the HPI.  Physical Exam Updated Vital Signs BP 137/76   Pulse 89   Temp 98.2 F (36.8 C)   Resp 16   SpO2 97%   Physical Exam  Constitutional: He appears well-developed and well-nourished. No distress.  HENT:  Head: Normocephalic and atraumatic.  Right Ear: External ear normal.  Left Ear: External ear normal.  Eyes: Conjunctivae are normal. Right eye exhibits no discharge. Left eye exhibits no discharge. No scleral icterus.  Neck: Neck supple. No JVD present. No tracheal deviation present.  Cardiovascular: Normal rate and regular rhythm.  No murmur heard. Pulmonary/Chest: No stridor. He is in respiratory distress. He has no wheezes. He has no rales.  Diminished breath sounds. Respiratory distress. 26 on respiratory monitor. No accessory muscle use. No stridor.   Abdominal: He exhibits no distension.  Musculoskeletal: He exhibits no edema.  No pitting edema in LE.   Neurological: He is alert. Cranial nerve deficit: no gross deficits.  Skin: Skin is warm and dry. No rash noted.  Psychiatric: He has a normal mood and affect.  Nursing note and vitals reviewed.    ED  Treatments / Results  DIAGNOSTIC STUDIES: Oxygen Saturation is 97% on RA, normal by my interpretation.    COORDINATION OF CARE: 9:45 PM- Pt advised of plan for treatment and pt agrees.  Labs (all labs ordered are listed, but only abnormal results are displayed) Labs Reviewed  BASIC METABOLIC PANEL - Abnormal; Notable for the following:       Result Value   Glucose, Bld 131 (*)    All other components within normal limits  CBC WITH DIFFERENTIAL/PLATELET    EKG  EKG Interpretation None       Radiology Dg Chest 2 View  Result Date: 01/23/2016 CLINICAL DATA:  Acute onset of worsening shortness of breath. Initial encounter. EXAM: CHEST  2 VIEW COMPARISON:  Chest radiograph performed 11/13/2014 FINDINGS: The lungs are well-aerated and clear. There is no evidence of focal opacification, pleural effusion or pneumothorax. The heart is normal in size; the mediastinal contour is within normal limits. No acute osseous abnormalities are seen. IMPRESSION: No acute cardiopulmonary process seen. Electronically Signed   By: Roanna RaiderJeffery  Chang M.D.   On: 01/23/2016 21:40    Procedures Procedures (including critical care time)  Medications Ordered in ED Medications  albuterol (PROVENTIL,VENTOLIN) solution continuous neb (0 mg/hr Nebulization Stopped 01/23/16 2347)  albuterol (PROVENTIL,VENTOLIN) solution continuous neb (0 mg/hr Nebulization Stopped 01/23/16 2340)  ibuprofen (ADVIL,MOTRIN) tablet 600 mg (not administered)  magnesium sulfate IVPB 2 g 50 mL (0 g Intravenous Stopped 01/23/16 2255)  methylPREDNISolone sodium succinate (SOLU-MEDROL) 125 mg/2 mL injection 125 mg (125 mg Intravenous Given 01/23/16 2155)     Initial Impression / Assessment and Plan / ED Course  I have reviewed the triage vital signs and the nursing notes.  Pertinent labs & imaging results that were available during my care of the patient were reviewed by me and considered in my medical decision making (see chart for  details).   Clinical Course  Comment By Time  Repeat exam reveals clearing of wheezing in all lung fields. Patient is not in any respiratory distress. His lungs sound better. Pt is getting another rounds of nebs. We will reassess once the treatment is done to decide if CT PE is needed.  Derwood KaplanAnkit Marriah Sanderlin, MD 09/19 2320  Pt given motrin for his pain. No PE risk factors and clinical concerns for that is low. Strict ER return precautions have been discussed, and patient is agreeing with the plan and is comfortable with the workup done and the recommendations from the ER. Derwood KaplanAnkit Kahari Critzer, MD 09/20 0038   I personally performed the services described in this documentation, which was scribed in my presence. The recorded information has been reviewed and is accurate.  Pt comes in with cc of DIB. He has hx of asthma, and is having poor aeration. No wheezing noted by me, but he was in the midst of 20 mg/hr of nebulized tx. Pt has pleuritic chest pain.  Pt has no hx of PE, DVT  and denies any exogenous hormone use, long distance travels or surgery in the past 6 weeks, active cancer, recent immobilization. He does report mother having a blood clot. We will reassess post nebs, but the pre-test probability for PE is extremely low. Also we dont think he has myocarditis/pericarditis. Smoking cessation instruction/counseling given:  counseled patient on the dangers of tobacco use, advised patient to stop smoking, and reviewed strategies to maximize success. Discussion for 3-5 min.   Final Clinical Impressions(s) / ED Diagnoses   Final diagnoses:  SOB (shortness of breath)  Acute asthma exacerbation, unspecified asthma severity    New Prescriptions New Prescriptions   PREDNISONE (DELTASONE) 10 MG TABLET    Take 6 tablets (60 mg total) by mouth daily.      Derwood Kaplan, MD 01/24/16 1610

## 2016-01-24 MED ORDER — IBUPROFEN 400 MG PO TABS
600.0000 mg | ORAL_TABLET | Freq: Once | ORAL | Status: AC
  Administered 2016-01-24: 600 mg via ORAL
  Filled 2016-01-24: qty 1

## 2016-01-24 MED ORDER — PREDNISONE 10 MG PO TABS: 60.0000 mg | ORAL_TABLET | Freq: Every day | ORAL | 0 refills | Status: DC

## 2016-01-24 NOTE — ED Notes (Signed)
Patient is resting comfortably. 

## 2016-01-24 NOTE — ED Notes (Signed)
MD at bedside. 

## 2016-01-24 NOTE — Discharge Instructions (Signed)
We saw you in the ER for your asthma related complains. We gave you some breathing treatments in the ER, and seems like your symptoms have improved. Please take albuterol as needed every 4 hours. Please take the medications prescribed. Please refrain from smoking or smoke exposure. Please see a primary care doctor in 1 week. Return to the ER if your symptoms worsen.  

## 2016-02-19 ENCOUNTER — Emergency Department (HOSPITAL_BASED_OUTPATIENT_CLINIC_OR_DEPARTMENT_OTHER)
Admission: EM | Admit: 2016-02-19 | Discharge: 2016-02-19 | Disposition: A | Discharge status: Home / Self Care | Payer: Self-pay | Attending: Emergency Medicine | Admitting: Emergency Medicine

## 2016-02-19 ENCOUNTER — Encounter (HOSPITAL_BASED_OUTPATIENT_CLINIC_OR_DEPARTMENT_OTHER): Payer: Self-pay | Admitting: *Deleted

## 2016-02-19 DIAGNOSIS — F17210 Nicotine dependence, cigarettes, uncomplicated: Secondary | ICD-10-CM | POA: Insufficient documentation

## 2016-02-19 DIAGNOSIS — Z7951 Long term (current) use of inhaled steroids: Secondary | ICD-10-CM | POA: Insufficient documentation

## 2016-02-19 DIAGNOSIS — N390 Urinary tract infection, site not specified: Secondary | ICD-10-CM | POA: Insufficient documentation

## 2016-02-19 DIAGNOSIS — J45909 Unspecified asthma, uncomplicated: Secondary | ICD-10-CM | POA: Insufficient documentation

## 2016-02-19 LAB — URINALYSIS, ROUTINE W REFLEX MICROSCOPIC
Bilirubin Urine: NEGATIVE
Glucose, UA: NEGATIVE mg/dL
Hgb urine dipstick: NEGATIVE
KETONES UR: NEGATIVE mg/dL
NITRITE: NEGATIVE
PROTEIN: NEGATIVE mg/dL
Specific Gravity, Urine: 1.021 (ref 1.005–1.030)
pH: 5.5 (ref 5.0–8.0)

## 2016-02-19 LAB — URINE MICROSCOPIC-ADD ON: RBC / HPF: NONE SEEN RBC/hpf (ref 0–5)

## 2016-02-19 MED ORDER — CEPHALEXIN 500 MG PO CAPS: 500.0000 mg | ORAL_CAPSULE | Freq: Three times a day (TID) | ORAL | 0 refills | Status: DC

## 2016-02-19 NOTE — ED Provider Notes (Signed)
[facility]   CSN: [id number] Arrival date & time: 02/19/16  [id number]     History   Chief Complaint Chief Complaint  Patient presents with  . Dysuria    HPI Matthew Cooley is a 25 y.o. male.  The history is provided by the patient. No language interpreter was used.  Dysuria     Matthew FoundMichael Cooley is a 25 y.o. male who presents to the Emergency Department complaining of dysuria.  Yesterday he developed burning with urination at the end of history. He had a small amount of blood when he wipes. He voided multiple times after that without any recurrent symptoms. This morning when he went to void he again had burning with urination. No penile discharge. No fevers, nausea, vomiting, abdominal pain. No prior similar symptoms.  He has a hx/o prior UTI.  No hx/o STI. Past Medical History:  Diagnosis Date  . Asthma     There are no active problems to display for this patient.   Past Surgical History:  Procedure Laterality Date  . HAND SURGERY    . JOINT REPLACEMENT    . MOUTH SURGERY         Home Medications    Prior to Admission medications   Medication Sig Start Date End Date Taking? Authorizing Provider  albuterol (PROVENTIL) (5 MG/ML) 0.5% nebulizer solution Take 2.5 mg by nebulization every 6 (six) hours as needed for wheezing or shortness of breath.   Yes Historical Provider, MD  ALBUTEROL IN Inhale into the lungs.   Yes Historical Provider, MD  Fluticasone-Salmeterol (ADVAIR) 100-50 MCG/DOSE AEPB Inhale 1 puff into the lungs every 12 (twelve) hours.     Yes Historical Provider, MD  cephALEXin (KEFLEX) 500 MG capsule Take 1 capsule (500 mg total) by mouth 3 (three) times daily. 02/19/16   Tilden FossaElizabeth Jacub Waiters, MD    Family History No family history on file.  Social History Social History  Substance Use Topics  . Smoking status: Current Every Day Smoker    Years: 5.00    Types: Cigarettes  . Smokeless tobacco: Never Used  . Alcohol use Yes    Comment: occasional     Allergies   Diphenhydramine hcl   Review of Systems Review of Systems  Genitourinary: Positive for dysuria.  All other systems reviewed and are negative.    Physical Exam Updated Vital Signs BP 126/88 (BP Location: Left Arm)   Pulse 84   Temp 98.2 F (36.8 C) (Oral)   Resp 18   Ht 5\' 10"  (1.778 m)   Wt 264 lb 4.8 oz (119.9 kg)   SpO2 98%   BMI 37.92 kg/m   Physical Exam  Constitutional: He is oriented to person, place, and time. He appears well-developed and well-nourished.  HENT:  Head: Normocephalic and atraumatic.  Cardiovascular: Normal rate and regular rhythm.   No murmur heard. Pulmonary/Chest: Effort normal and breath sounds normal. No respiratory distress.  Abdominal: Soft. There is no tenderness. There is no rebound and no guarding.  Genitourinary: Penis normal.  Genitourinary Comments: No penile tenderness or discharge. No lesions.  Musculoskeletal: He exhibits no edema or tenderness.  Neurological: He is alert and oriented to person, place, and time.  Skin: Skin is warm and dry.  Psychiatric: He has a normal mood and affect. His behavior is normal.  Nursing note and vitals reviewed.    ED Treatments / Results  Labs (all labs ordered are listed, but only abnormal results are displayed) Labs Reviewed  URINALYSIS, ROUTINE W REFLEX MICROSCOPIC (NOT AT [facility]) - Abnormal; Notable for the following:       Result Value   Leukocytes, UA SMALL (*)    All other components within normal limits  URINE MICROSCOPIC-ADD ON - Abnormal; Notable for the following:    Squamous Epithelial / LPF 0-5 (*)    Bacteria, UA FEW (*)    All other components within normal limits  URINE CULTURE  GC/CHLAMYDIA PROBE AMP ([place]) NOT AT [facility]    EKG  EKG Interpretation None       Radiology No results found.  Procedures Procedures (including critical care time)  Medications Ordered in ED Medications - No data to display   Initial  Impression / Assessment and Plan / ED Course  I have reviewed the triage vital signs and the nursing notes.  Pertinent labs & imaging results that were available during my care of the patient were reviewed by me and considered in my medical decision making (see chart for details).  Clinical Course    Patient here with dysuria intermittently for the last 2 days. He has no penile discharge. He has no history of STI. Presentation is not consistent with prostatitis. Will treat for early UTI given the sites and bacteria, cultures for GC and Chlamydia sent. Discussed outpatient follow-up and return precautions.  Final Clinical Impressions(s) / ED Diagnoses   Final diagnoses:  Lower urinary tract infectious disease    New Prescriptions Discharge Medication List as of 02/19/2016 10:14 AM    START taking these medications   Details  cephALEXin (KEFLEX) 500 MG capsule Take 1 capsule (500 mg total) by mouth 3 (three) times daily., Starting Mon 02/19/2016, Print         Tilden Fossa, MD 02/19/16 1040

## 2016-02-19 NOTE — ED Triage Notes (Signed)
C/o burning on urination. After he wiped he noticed blood on tissue. Denies penile discharge. Onset yesterday.

## 2016-02-20 LAB — URINE CULTURE: Culture: NO GROWTH

## 2016-02-20 LAB — GC/CHLAMYDIA PROBE AMP (~~LOC~~) NOT AT ARMC
CHLAMYDIA, DNA PROBE: NEGATIVE
NEISSERIA GONORRHEA: NEGATIVE

## 2017-02-23 ENCOUNTER — Emergency Department (HOSPITAL_BASED_OUTPATIENT_CLINIC_OR_DEPARTMENT_OTHER)
Admission: EM | Admit: 2017-02-23 | Discharge: 2017-02-23 | Disposition: A | Discharge status: Home / Self Care | Payer: Self-pay | Attending: Emergency Medicine | Admitting: Emergency Medicine

## 2017-02-23 ENCOUNTER — Encounter (HOSPITAL_BASED_OUTPATIENT_CLINIC_OR_DEPARTMENT_OTHER): Payer: Self-pay | Admitting: Emergency Medicine

## 2017-02-23 DIAGNOSIS — Z79899 Other long term (current) drug therapy: Secondary | ICD-10-CM | POA: Insufficient documentation

## 2017-02-23 DIAGNOSIS — J45909 Unspecified asthma, uncomplicated: Secondary | ICD-10-CM | POA: Insufficient documentation

## 2017-02-23 DIAGNOSIS — K047 Periapical abscess without sinus: Secondary | ICD-10-CM | POA: Insufficient documentation

## 2017-02-23 DIAGNOSIS — F17210 Nicotine dependence, cigarettes, uncomplicated: Secondary | ICD-10-CM | POA: Insufficient documentation

## 2017-02-23 MED ORDER — LIDOCAINE-EPINEPHRINE (PF) 2 %-1:200000 IJ SOLN
5.0000 mL | Freq: Once | INTRAMUSCULAR | Status: AC
  Administered 2017-02-23: 5 mL via INTRADERMAL

## 2017-02-23 MED ORDER — AMOXICILLIN-POT CLAVULANATE 875-125 MG PO TABS: 1.0000 | ORAL_TABLET | Freq: Two times a day (BID) | ORAL | 0 refills | Status: DC

## 2017-02-23 MED ORDER — KETOROLAC TROMETHAMINE 60 MG/2ML IM SOLN
30.0000 mg | Freq: Once | INTRAMUSCULAR | Status: AC
  Administered 2017-02-23: 30 mg via INTRAMUSCULAR
  Filled 2017-02-23: qty 2

## 2017-02-23 MED ORDER — LIDOCAINE-EPINEPHRINE 2 %-1:100000 IJ SOLN: 5.0000 mL | Freq: Once | INTRAMUSCULAR | Status: DC

## 2017-02-23 MED ORDER — IBUPROFEN 600 MG PO TABS: 600.0000 mg | ORAL_TABLET | Freq: Four times a day (QID) | ORAL | 0 refills | Status: DC | PRN

## 2017-02-23 MED ORDER — HYDROCODONE-ACETAMINOPHEN 5-325 MG PO TABS
1.0000 | ORAL_TABLET | Freq: Once | ORAL | Status: AC
  Administered 2017-02-23: 1 via ORAL
  Filled 2017-02-23: qty 1

## 2017-02-23 MED ORDER — AMOXICILLIN-POT CLAVULANATE 875-125 MG PO TABS
1.0000 | ORAL_TABLET | Freq: Once | ORAL | Status: AC
  Administered 2017-02-23: 1 via ORAL
  Filled 2017-02-23: qty 1

## 2017-02-23 NOTE — ED Provider Notes (Signed)
[facility] EMERGENCY DEPARTMENT Provider Note  CSN: [id number] Arrival date & time: 02/23/17 2033  Chief Complaint(s) Dental Problem  HPI Matthew Cooley is a 26 y.o. male with a history of dental abscess to the left lower molars presents to the emergency department with right facial swelling and dental pain for 1-1/2 months.  Patient reports that this is similar to his prior dental abscess in the past.  States that he saw a dentist who referred him to an oral surgeon that would not perform the procedure on him.  He was not given any antibiotics for treatment. He is endorsing right facial swelling for the past week.  Pain is exacerbated with chewing and palpation of the right jaw.  No other alleviating or aggravating factors.  He denies any fevers, chills, nausea, vomiting.  No other associated symptoms.  HPI  Past Medical History Past Medical History:  Diagnosis Date  . Asthma    There are no active problems to display for this patient.  Home Medication(s) Prior to Admission medications   Medication Sig Start Date End Date Taking? Authorizing Provider  albuterol (PROVENTIL) (5 MG/ML) 0.5% nebulizer solution Take 2.5 mg by nebulization every 6 (six) hours as needed for wheezing or shortness of breath.    [provider]  ALBUTEROL IN Inhale into the lungs.    [provider]  amoxicillin-clavulanate (AUGMENTIN) 875-125 MG tablet Take 1 tablet by mouth 2 (two) times daily. 02/23/17 03/05/17  Nira Connardama, Pedro Eduardo, MD  cephALEXin (KEFLEX) 500 MG capsule Take 1 capsule (500 mg total) by mouth 3 (three) times daily. 02/19/16   Tilden Fossaees, Elizabeth, MD  Fluticasone-Salmeterol (ADVAIR) 100-50 MCG/DOSE AEPB Inhale 1 puff into the lungs every 12 (twelve) hours.      [provider]  ibuprofen (ADVIL,MOTRIN) 600 MG tablet Take 1 tablet (600 mg total) by mouth every 6 (six) hours as needed. 02/23/17   Nira Connardama, Pedro Eduardo, MD                                                                                                   Past Surgical History Past Surgical History:  Procedure Laterality Date  . HAND SURGERY    . JOINT REPLACEMENT    . MOUTH SURGERY     Family History No family history on file.  Social History Social History  Substance Use Topics  . Smoking status: Current Every Day Smoker    Years: 5.00    Types: Cigarettes  . Smokeless tobacco: Never Used  . Alcohol use Yes     Comment: occasional   Allergies Diphenhydramine hcl  Review of Systems Review of Systems All other systems are reviewed and are negative for acute change except as noted in the HPI  Physical Exam Vital Signs  I have reviewed the triage vital signs BP 111/76 (BP Location: Left Arm)   Pulse 67   Temp 98.1 F (36.7 C) (Oral)   Resp 18   SpO2 99%   Physical Exam  Constitutional: He is oriented to person, place, and time. He appears well-developed and well-nourished. No distress.  HENT:  Head: Normocephalic and atraumatic.  Right Ear: External ear normal.  Left Ear: External ear normal.  Nose: Nose normal.  Mouth/Throat: Mucous membranes are normal. There is trismus in the jaw.    Patient with lymphadenopathy of the right side of mandibular region.  No appreciable fluctuance in the submental/submandibular region.  Eyes: Conjunctivae and EOM are normal. No scleral icterus.  Neck: Normal range of motion and phonation normal.  Cardiovascular: Normal rate and regular rhythm.   Pulmonary/Chest: Effort normal. No stridor. No respiratory distress.  Abdominal: He exhibits no distension.  Musculoskeletal: Normal range of motion. He exhibits no edema.  Lymphadenopathy:       Head (right side): Submandibular adenopathy present.  Neurological: He is alert and oriented to person, place, and time.  Skin: He is not diaphoretic.  Psychiatric: He has a normal mood and affect. His behavior is normal.  Vitals reviewed.   ED  Results and Treatments Labs (all labs ordered are listed, but only abnormal results are displayed) Labs Reviewed - No data to display                                                                                                                       EKG  EKG Interpretation  Date/Time:    Ventricular Rate:    PR Interval:    QRS Duration:   QT Interval:    QTC Calculation:   R Axis:     Text Interpretation:        Radiology No results found. Pertinent labs & imaging results that were available during my care of the patient were reviewed by me and considered in my medical decision making (see chart for details).  Medications Ordered in ED Medications  lidocaine-EPINEPHrine (XYLOCAINE W/EPI) 2 %-1:200000 (PF) injection 5 mL (not administered)  ketorolac (TORADOL) injection 30 mg (not administered)  amoxicillin-clavulanate (AUGMENTIN) 875-125 MG per tablet 1 tablet (1 tablet Oral Given 02/23/17 2108)                                                                                                                                    Procedures .Marland KitchenIncision and Drainage Date/Time: 02/23/2017 9:18 PM Performed by: Nira Conn Authorized by: Nira Conn   Consent:    Consent obtained:  Verbal   Consent given by:  Patient   Risks discussed:  Bleeding, incomplete drainage and infection   Alternatives discussed:  No treatment Location:    Type:  Abscess   Size:  1cm   Location:  Mouth   Mouth location:  Alveolar process Anesthesia (see MAR for exact dosages):    Anesthesia method:  Nerve block   Block location:  Right inferior alveolar   Block needle gauge:  27 G   Block anesthetic:  Lidocaine 2% WITH epi   Block injection procedure:  Anatomic landmarks identified, introduced needle and incremental injection   Block outcome:  Anesthesia achieved Procedure type:    Complexity:  Simple Procedure details:    Needle aspiration: no     Incision types:  Single  straight   Incision depth:  Subcutaneous   Scalpel blade:  11   Drainage:  Bloody   Drainage amount:  Scant   Wound treatment:  Wound left open Post-procedure details:    Patient tolerance of procedure:  Tolerated well, no immediate complications     (including critical care time)  Medical Decision Making / ED Course I have reviewed the nursing notes for this encounter and the patient's prior records (if available in EHR or on provided paperwork).    Consistent with periodontal abscess.  No evidence of Ludwig's angina or deeper dental infection at this time.  Incision and drainage was performed.  Patient will be provided with prescription for oral antibiotics.  Recommend close oral surgeon follow-up.  Final Clinical Impression(s) / ED Diagnoses Final diagnoses:  Dental abscess    Disposition: Discharge  Condition: Good  I have discussed the results, Dx and Tx plan with the patient who expressed understanding and agree(s) with the plan. Discharge instructions discussed at great length. The patient was given strict return precautions who verbalized understanding of the instructions. No further questions at time of discharge.    New Prescriptions   AMOXICILLIN-CLAVULANATE (AUGMENTIN) 875-125 MG TABLET    Take 1 tablet by mouth 2 (two) times daily.   IBUPROFEN (ADVIL,MOTRIN) 600 MG TABLET    Take 1 tablet (600 mg total) by mouth every 6 (six) hours as needed.    Follow Up: Dentist/oral surgery  Call       This chart was dictated using voice recognition software.  Despite best efforts to proofread,  errors can occur which can change the documentation meaning.   Nira Conn, MD 02/23/17 2213

## 2017-02-23 NOTE — ED Notes (Signed)
EDP into room, prior to RN assessment, see MD notes, pending orders.   

## 2017-02-23 NOTE — ED Notes (Signed)
Pt given d/c instructions as per chart. Verbalizes understanding. No questions. Instructed not to drive.

## 2017-02-23 NOTE — ED Notes (Signed)
Alert, NAD, calm, interactive, resps e/u, speaking in clear complete sentences, no dyspnea noted, skin W&D, initial VS unremarkable[facility]

## 2017-02-23 NOTE — ED Notes (Signed)
Alert, NAD, calm, interactive, resps e/u, speaking in clear complete sentences, no dyspnea noted, skin W&D, VSS, c/o continued 10/10 pain, and swelling. Augmentin and ibuprofen explained. Tylenol explained.  Salt water rinses, ice and dental f/u explained. [facility] Denies questions or needs. Steady gait, out with family.

## 2017-02-23 NOTE — ED Triage Notes (Signed)
PT presents with c/o swelling to right side of face.

## 2017-02-24 MED FILL — AMOX-CLAV 875-125 MG TABLET: 875-125 | 10 days supply | Qty: 20 | Fill #0

## 2017-02-27 ENCOUNTER — Emergency Department (HOSPITAL_COMMUNITY): Payer: Self-pay

## 2017-02-27 ENCOUNTER — Encounter (HOSPITAL_COMMUNITY): Payer: Self-pay | Admitting: *Deleted

## 2017-02-27 ENCOUNTER — Inpatient Hospital Stay (HOSPITAL_COMMUNITY)
Admission: EM | Admit: 2017-02-27 | Discharge: 2017-03-02 | DRG: 159 | Disposition: A | Discharge status: Home / Self Care | Payer: Self-pay | Attending: Family Medicine | Admitting: Family Medicine

## 2017-02-27 DIAGNOSIS — R252 Cramp and spasm: Secondary | ICD-10-CM

## 2017-02-27 DIAGNOSIS — K047 Periapical abscess without sinus: Principal | ICD-10-CM | POA: Diagnosis present

## 2017-02-27 DIAGNOSIS — R220 Localized swelling, mass and lump, head: Secondary | ICD-10-CM

## 2017-02-27 DIAGNOSIS — F17210 Nicotine dependence, cigarettes, uncomplicated: Secondary | ICD-10-CM | POA: Diagnosis present

## 2017-02-27 DIAGNOSIS — Z79899 Other long term (current) drug therapy: Secondary | ICD-10-CM

## 2017-02-27 DIAGNOSIS — J45909 Unspecified asthma, uncomplicated: Secondary | ICD-10-CM | POA: Diagnosis present

## 2017-02-27 DIAGNOSIS — Z888 Allergy status to other drugs, medicaments and biological substances status: Secondary | ICD-10-CM

## 2017-02-27 DIAGNOSIS — Z720 Tobacco use: Secondary | ICD-10-CM

## 2017-02-27 LAB — CBC WITH DIFFERENTIAL/PLATELET
BASOS ABS: 0 10*3/uL (ref 0.0–0.1)
BASOS PCT: 0 %
EOS PCT: 1 %
Eosinophils Absolute: 0.1 10*3/uL (ref 0.0–0.7)
HCT: 46.8 % (ref 39.0–52.0)
HEMOGLOBIN: 16.1 g/dL (ref 13.0–17.0)
LYMPHS ABS: 1.9 10*3/uL (ref 0.7–4.0)
Lymphocytes Relative: 17 %
MCH: 28.3 pg (ref 26.0–34.0)
MCHC: 34.4 g/dL (ref 30.0–36.0)
MCV: 82.2 fL (ref 78.0–100.0)
Monocytes Absolute: 1.2 10*3/uL — ABNORMAL HIGH (ref 0.1–1.0)
Monocytes Relative: 10 %
Neutro Abs: 8 10*3/uL — ABNORMAL HIGH (ref 1.7–7.7)
Neutrophils Relative %: 72 %
Platelets: 292 10*3/uL (ref 150–400)
RBC: 5.69 MIL/uL (ref 4.22–5.81)
RDW: 13.6 % (ref 11.5–15.5)
WBC: 11.2 10*3/uL — AB (ref 4.0–10.5)

## 2017-02-27 LAB — BASIC METABOLIC PANEL
ANION GAP: 12 (ref 5–15)
BUN: 10 mg/dL (ref 6–20)
CALCIUM: 9.6 mg/dL (ref 8.9–10.3)
CO2: 27 mmol/L (ref 22–32)
Chloride: 96 mmol/L — ABNORMAL LOW (ref 101–111)
Creatinine, Ser: 1.12 mg/dL (ref 0.61–1.24)
GFR calc non Af Amer: >60 mL/min (ref >60)
GLUCOSE: 92 mg/dL (ref 65–99)
POTASSIUM: 4.2 mmol/L (ref 3.5–5.1)
Sodium: 135 mmol/L (ref 135–145)

## 2017-02-27 LAB — I-STAT CG4 LACTIC ACID, ED: Lactic Acid, Venous: 1.15 mmol/L (ref 0.5–1.9)

## 2017-02-27 MED ORDER — OXYCODONE-ACETAMINOPHEN 5-325 MG PO TABS
1.0000 | ORAL_TABLET | ORAL | Status: DC | PRN
  Administered 2017-02-27: 1 via ORAL

## 2017-02-27 MED ORDER — MORPHINE SULFATE (PF) 4 MG/ML IV SOLN
4.0000 mg | Freq: Once | INTRAVENOUS | Status: AC
  Administered 2017-02-27: 4 mg via INTRAVENOUS
  Filled 2017-02-27: qty 1

## 2017-02-27 MED ORDER — DEXAMETHASONE SODIUM PHOSPHATE 10 MG/ML IJ SOLN
10.0000 mg | Freq: Once | INTRAMUSCULAR | Status: AC
  Administered 2017-02-27: 10 mg via INTRAVENOUS
  Filled 2017-02-27: qty 1

## 2017-02-27 MED ORDER — METOCLOPRAMIDE HCL 5 MG/ML IJ SOLN
10.0000 mg | Freq: Once | INTRAMUSCULAR | Status: AC
  Administered 2017-02-27: 10 mg via INTRAVENOUS
  Filled 2017-02-27: qty 2

## 2017-02-27 MED ORDER — CLINDAMYCIN PHOSPHATE 600 MG/50ML IV SOLN
600.0000 mg | Freq: Once | INTRAVENOUS | Status: AC
  Administered 2017-02-27: 600 mg via INTRAVENOUS
  Filled 2017-02-27: qty 50

## 2017-02-27 MED ORDER — SODIUM CHLORIDE 0.9 % IV BOLUS (SEPSIS)
1000.0000 mL | Freq: Once | INTRAVENOUS | Status: AC
  Administered 2017-02-27: 1000 mL via INTRAVENOUS

## 2017-02-27 MED ORDER — KETOROLAC TROMETHAMINE 30 MG/ML IJ SOLN
30.0000 mg | Freq: Once | INTRAMUSCULAR | Status: AC
  Administered 2017-02-27: 30 mg via INTRAVENOUS
  Filled 2017-02-27: qty 1

## 2017-02-27 MED ORDER — ONDANSETRON HCL 4 MG/2ML IJ SOLN
4.0000 mg | Freq: Once | INTRAMUSCULAR | Status: AC
  Administered 2017-02-27: 4 mg via INTRAVENOUS
  Filled 2017-02-27: qty 2

## 2017-02-27 MED ORDER — IOPAMIDOL (ISOVUE-300) INJECTION 61%
INTRAVENOUS | Status: AC
  Administered 2017-02-27: 75 mL
  Filled 2017-02-27: qty 75

## 2017-02-27 MED ORDER — OXYCODONE-ACETAMINOPHEN 5-325 MG PO TABS
ORAL_TABLET | ORAL | Status: AC
  Filled 2017-02-27: qty 1

## 2017-02-27 NOTE — ED Notes (Signed)
Pt back from CT

## 2017-02-27 NOTE — ED Provider Notes (Signed)
[facility] Provider Note   CSN: [id number] Arrival date & time: 02/27/17  1415     History   Chief Complaint Chief Complaint  Patient presents with  . Dental Pain  . Facial Swelling    HPI Matthew FoundMichael Cooley is a 26 y.o. male with a PMHx of asthma, who presents to the ED with complaints of rapidly worsening right cheek swelling and dental pain. Patient states that he's had several issues with his right lower molar in the past, however over the last 1 week he developed increasing right facial swelling and pain. Chart review reveals that he was [facility], had an I&D of the dental abscess in the ER, and was discharged with ibuprofen and augmentin rx; referral given to dentistry/oral surgery. He states that he has been compliant on the Augmentin, and went to see the oral surgeon Dr. Fidela SalisburySimoncic in [place] who did xrays and felt that the abscess had become severe enough that it "may push on (his) airways" and required surgery (per pt report), so the surgeon called this hospital and Cooley out that there was a Dr. Chales Salmonwsley on call for oral surgery, therefore he was sent here to see him. He describes the pain as 10/10 constant throbbing right lower molar pain that radiates into the right ear, worse with chewing, and mildly improved with Percocet. He states that his pain and swelling have continued to worsen despite taking antibiotics, and he is now having more difficulty swallowing, stating that he is able to swallow his own saliva but is only able to eat applesauce because he can't swallow anything else. He is also having significant trismus, and nausea. He reports that the gum swelling in the right lower jaw is draining, which he swallows, but he thinks that's causing his nausea. He does not have a dentist, nor does he have a PCP. He has not eaten anything since yesterday. NKDA. He admits to being a cigarette smoker.   He denies any ear drainage,  rhinorrhea, drooling, fevers, chills, CP, SOB, abd pain, N/V/D/C, hematuria, dysuria, myalgias, arthralgias, numbness, tingling, focal weakness, or any other complaints at this time.    The history is provided by the patient and medical records. No language interpreter was used.  Dental Pain   This is a recurrent problem. The current episode started more than 2 days ago. The problem occurs constantly. The problem has been rapidly worsening. The pain is at a severity of 10/10. The pain is severe. Treatments tried: percocet. The treatment provided moderate relief.    Past Medical History:  Diagnosis Date  . Asthma     There are no active problems to display for this patient.   Past Surgical History:  Procedure Laterality Date  . HAND SURGERY    . JOINT REPLACEMENT    . MOUTH SURGERY         Home Medications    Prior to Admission medications   Medication Sig Start Date End Date Taking? Authorizing Provider  albuterol (PROVENTIL) (5 MG/ML) 0.5% nebulizer solution Take 2.5 mg by nebulization every 6 (six) hours as needed for wheezing or shortness of breath.    [provider]  ALBUTEROL IN Inhale into the lungs.    [provider]  amoxicillin-clavulanate (AUGMENTIN) 875-125 MG tablet Take 1 tablet by mouth 2 (two) times daily. 02/23/17 03/05/17  Nira Connardama, Pedro Eduardo, MD  cephALEXin (KEFLEX) 500 MG capsule Take 1 capsule (500 mg total) by mouth  3 (three) times daily. 02/19/16   Tilden Fossa, MD  Fluticasone-Salmeterol (ADVAIR) 100-50 MCG/DOSE AEPB Inhale 1 puff into the lungs every 12 (twelve) hours.      [provider]  ibuprofen (ADVIL,MOTRIN) 600 MG tablet Take 1 tablet (600 mg total) by mouth every 6 (six) hours as needed. 02/23/17   Nira Conn, MD    Family History No family history on file.  Social History Social History  Substance Use Topics  . Smoking status: Current Every Day Smoker    Years: 5.00    Types: Cigarettes  .  Smokeless tobacco: Never Used  . Alcohol use Yes     Comment: occasional     Allergies   Diphenhydramine hcl   Review of Systems Review of Systems  Constitutional: Negative for chills and fever.  HENT: Positive for dental problem, ear pain, facial swelling and trouble swallowing. Negative for drooling and ear discharge.        +trismus  Respiratory: Negative for shortness of breath.   Cardiovascular: Negative for chest pain.  Gastrointestinal: Positive for nausea. Negative for abdominal pain, constipation, diarrhea and vomiting.  Genitourinary: Negative for dysuria and hematuria.  Musculoskeletal: Negative for arthralgias and myalgias.  Skin: Negative for color change.  Allergic/Immunologic: Negative for immunocompromised state.  Neurological: Negative for weakness and numbness.  Psychiatric/Behavioral: Negative for confusion.   All other systems reviewed and are negative for acute change except as noted in the HPI.    Physical Exam Updated Vital Signs BP (!) 129/92 (BP Location: Left Arm)   Pulse 84   Temp 98.8 F (37.1 C) (Oral)   Resp 16   SpO2 100%   Physical Exam  Constitutional: He is oriented to person, place, and time. Vital signs are normal. He appears well-developed and well-nourished.  Non-toxic appearance. No distress.  Afebrile, nontoxic, NAD  HENT:  Head: Normocephalic and atraumatic.  Right Ear: Hearing, tympanic membrane, external ear and ear canal normal.  Left Ear: Hearing, tympanic membrane, external ear and ear canal normal.  Nose: Nose normal.  Mouth/Throat: Uvula is midline and oropharynx is clear and moist. Mucous membranes are dry. There is trismus in the jaw. Dental abscesses present. No uvula swelling. Tonsils are 0 on the right. Tonsils are 0 on the left. No tonsillar exudate.  Slightly dry lips and mucous membranes Ears are clear bilaterally. Nose clear.  Significant R cheek swelling which is exquisitely tender to palpation, no overlying skin  changes to the face.  Moderate trismus which limits exam slightly, only able to open mouth about 2cm. R lower molar #32 with surrounding abscess, unable to appreciate if it's fluctuant due to pt discomfort and intolerance of exam. No active drainage noted. Posterior oropharynx clear and moist, without uvular swelling or deviation, no drooling during exam but mouth is dry. No tonsillar swelling or erythema, no exudates.   No firmness to submental area, unable to assess floor of mouth due to degree of trismus.   Eyes: Conjunctivae and EOM are normal. Right eye exhibits no discharge. Left eye exhibits no discharge.  Neck: Normal range of motion. Neck supple.  Cardiovascular: Normal rate and intact distal pulses.   Pulmonary/Chest: Effort normal. No respiratory distress.  Abdominal: Normal appearance. He exhibits no distension.  Musculoskeletal: Normal range of motion.  Lymphadenopathy:       Head (right side): Submandibular adenopathy present.  R submandibular LAD which is exquisitely TTP  Neurological: He is alert and oriented to person, place, and time. He has  normal strength. No sensory deficit.  Skin: Skin is warm, dry and intact. No rash noted.  Psychiatric: He has a normal mood and affect.  Nursing note and vitals reviewed.    ED Treatments / Results  Labs (all labs ordered are listed, but only abnormal results are displayed) Labs Reviewed  CBC WITH DIFFERENTIAL/PLATELET  BASIC METABOLIC PANEL  I-STAT CG4 LACTIC ACID, ED    EKG  EKG Interpretation None       Radiology No results Cooley.  Procedures Procedures (including critical care time)  Medications Ordered in ED Medications  oxyCODONE-acetaminophen (PERCOCET/ROXICET) 5-325 MG per tablet 1 tablet (1 tablet Oral Given 02/27/17 1429)  oxyCODONE-acetaminophen (PERCOCET/ROXICET) 5-325 MG per tablet (not administered)  sodium chloride 0.9 % bolus 1,000 mL (not administered)  ondansetron (ZOFRAN) injection 4 mg (not  administered)  morphine 4 MG/ML injection 4 mg (not administered)  iopamidol (ISOVUE-300) 61 % injection (not administered)  clindamycin (CLEOCIN) IVPB 600 mg (not administered)     Initial Impression / Assessment and Plan / ED Course  I have reviewed the triage vital signs and the nursing notes.  Pertinent labs & imaging results that were available during my care of the patient were reviewed by me and considered in my medical decision making (see chart for details).     26 y.o. male here due to worsening R facial swelling related to dental issue. Was seen in the ER 02/23/17 and had I&D performed on a R lower molar abscess, was sent home with augmentin which he has been compliant with. Saw an oral surgeon in [place] today who felt that the abscess was becoming severe enough to be concerning for airway/retropharyngeal compromise, so he called the hospital here and Cooley that there was an oral surgeon on call, so he was sent here to see Dr. Chales Salmon (who was on call until 4pm). He's had trismus and has been unable to eat much more than just applesauce; having difficulty managing his secretions, although still able to swallow his own saliva. On exam, significant R cheek swelling, moderate trismus which limits evaluation, unable to open more than about 2cm, R lower molar #32 with surrounding abscess which is exquisitely tender, unable to appreciate if it's fluctuant or not due to pt discomfort, no active drainage noted. No firmness appreciated to submental area of jaw, no oropharyngeal swelling appreciated, handling secretions during evaluation, ears clear bilaterally, oropharynx clear. R submandibular LAD which is exquisitely tender.  Unfortunately, Dr. Chales Salmon is no longer on call, and there is no oral surgeon at this time. Given his failure of outpatient and significant trismus, will proceed with CT maxillofacial to evaluate for deeper space infection; will get labs, give fluids, pain meds, nausea meds,  and reassess shortly. May need admission for IV abx and oral surgeon consultation in the morning vs ENT consult tonight. Will reassess shortly  8:14 PM Work up still pending. Patient care to be resumed by Antony Madura, PA-C at shift change sign-out. Patient history has been discussed with midlevel resuming care. Please see their notes for further documentation of pending results and dispo/care. Pt stable at sign-out and updated on transfer of care.      Final Clinical Impressions(s) / ED Diagnoses   Final diagnoses:  Dental abscess  Trismus  Facial swelling  Tobacco user    New Prescriptions New Prescriptions   No medications on [address] [place], [place] 02/27/17 2336    Gerhard Munch, MD 02/27/17 2359

## 2017-02-27 NOTE — ED Triage Notes (Signed)
To ED for eval of mouth swelling and dental pain. Pt was seen by an oral surgeon this am in [place] but told to come to ED due to lack of insurance. Pt states he was told that an oral surgeon is on call for the ED and he'd be able to see them. Pt appears in pain. Taking Augementin 875mg  BID since 4 days ago.

## 2017-02-27 NOTE — ED Notes (Signed)
Pt also declining IV and blood work until placed in a room

## 2017-02-27 NOTE — ED Notes (Signed)
Pt continues to refuse to cooperate. Pt refused lab draw from phlebotomy and also refused to sign for registration. Pt aware his refusal will delay his care. Pt verbalized this understanding to [place], [place]. Will continue to monitor.

## 2017-02-27 NOTE — ED Provider Notes (Signed)
9:00 PM Patient care assumed from [name], PA-C at shift change. Patient presenting for facial pain and swelling c/w dental abscess/cellulitis. Had abscess drained in the ED on 02/23/17. Compliant with antibiotics, afebrile today. Reportedly went to a dentist today who stated the patient needed surgery and to come to [facility] to see an oral surgeon; however, oral surgery no longer on call once patient arrived.  Patient receiving IV clindamycin given degree of trismus on exam. CT ordered to evaluate extent of infection.   Patient, unfortunately, has yet to go for his CT. He is adamant that work up cannot be completed until he is bedded in a room. Patient currently in a hallway bed due to volume constraints. He states that he is "thoroughly dissatisfied" with his treatment in our ED because of being bedded in a hallway. Patient's refusal to proceed with work up until formally bedded in a room is contributing to delay in care; patient verbalizes that he is aware that he is responsible for this currently delay in care.  9:30 PM Patient roomed in 542. RN obtaining blood work.  9:41 PM Patient vomiting after receiving morphine, despite Zofran. Reglan ordered.  10:20 PM Patient updated on pending CT. Offered Toradol and IV steroids for inflammation. Patient states that he would rather wait until his imaging results prior to receiving additional medications.  10:35 PM CT with the following findings: IMPRESSION: 1. 2.8 x 1.4 x 3.3 cm RIGHT facial abscess with intermuscular extension, odontogenic in etiology. Tooth 31 periapical abscess. 2. Mild reactive RIGHT neck lymphadenopathy. Widely patent airway.  Given that odontogenic abscess is persisting despite 3 days of oral antibiotics and prior abscess drainage in the ED, clinical picture concerning for failed OP management. Intermuscular extension of abscess c/w trismus on exam. Plan to discuss observation admission with the hospitalist; anticipate  oral surgery consultation in the morning.  11:15 PM Case discussed with Dr. Onalee Huaavid who will admit.    Antony MaduraHumes, Marquisha Nikolov, PA-C 02/28/17 [id number]    Gerhard MunchLockwood, Robert, MD 03/03/17 [phone]

## 2017-02-27 NOTE — ED Notes (Addendum)
Charge RN and Irving BurtonEmily, AD aware of pt and family complaints. Pt and family visibly angry. Per Irving BurtonEmily, AD, pt and pt family were aware pt would be placed in a hallway bed per a previous phone call pt's family member made to OnawayEmily, AD. This RN attempted therapeutic communication. Will continue to round.

## 2017-02-27 NOTE — ED Notes (Signed)
Pt taken to CT.

## 2017-02-27 NOTE — ED Notes (Addendum)
Updated pt and family to delay with room assignment. Offered medications and initiation of care to pt when pt is ready.

## 2017-02-27 NOTE — ED Notes (Signed)
Pt and family visibly irritated at this time. Pt states "I don't know why I had to wait so long just to see the oral surgeon." Explained delay to pt and family. Pt then states "well my airway is compromised because of this." Explained to pt he was triaged appropriately due to being able to speak in full sentences, no drooling noted, clear breath sounds, and no respiratory distress. Will continue to monitor.

## 2017-02-27 NOTE — ED Notes (Signed)
Pt declining to go to CT until "he gets put in a room."

## 2017-02-27 NOTE — ED Notes (Signed)
Pt now vomiting after being given morphine. Kelly notified and ordering reglan.

## 2017-02-27 NOTE — ED Notes (Signed)
ED Provider at bedside. 

## 2017-02-28 ENCOUNTER — Encounter (HOSPITAL_COMMUNITY): Payer: Self-pay | Admitting: Family Medicine

## 2017-02-28 DIAGNOSIS — K047 Periapical abscess without sinus: Principal | ICD-10-CM | POA: Diagnosis present

## 2017-02-28 DIAGNOSIS — J45909 Unspecified asthma, uncomplicated: Secondary | ICD-10-CM

## 2017-02-28 DIAGNOSIS — R220 Localized swelling, mass and lump, head: Secondary | ICD-10-CM | POA: Insufficient documentation

## 2017-02-28 MED ORDER — ALBUTEROL SULFATE (2.5 MG/3ML) 0.083% IN NEBU
2.5000 mg | INHALATION_SOLUTION | Freq: Four times a day (QID) | RESPIRATORY_TRACT | Status: DC | PRN
  Administered 2017-03-02: 2.5 mg via RESPIRATORY_TRACT
  Filled 2017-02-28: qty 3

## 2017-02-28 MED ORDER — KETOROLAC TROMETHAMINE 30 MG/ML IJ SOLN
30.0000 mg | Freq: Four times a day (QID) | INTRAMUSCULAR | Status: DC | PRN
  Administered 2017-02-28 – 2017-03-01 (×4): 30 mg via INTRAVENOUS
  Filled 2017-02-28 (×5): qty 1

## 2017-02-28 MED ORDER — DEXTROSE-NACL 5-0.9 % IV SOLN
INTRAVENOUS | Status: DC
  Administered 2017-02-28 – 2017-03-01 (×3): via INTRAVENOUS

## 2017-02-28 MED ORDER — SODIUM CHLORIDE 0.9% FLUSH: 3.0000 mL | INTRAVENOUS | Status: DC | PRN

## 2017-02-28 MED ORDER — SODIUM CHLORIDE 0.9 % IV SOLN
250.0000 mL | INTRAVENOUS | Status: DC | PRN
  Administered 2017-02-28: 250 mL via INTRAVENOUS

## 2017-02-28 MED ORDER — ONDANSETRON HCL 4 MG PO TABS: 4.0000 mg | ORAL_TABLET | Freq: Four times a day (QID) | ORAL | Status: DC | PRN

## 2017-02-28 MED ORDER — ONDANSETRON HCL 4 MG/2ML IJ SOLN
4.0000 mg | Freq: Four times a day (QID) | INTRAMUSCULAR | Status: DC | PRN
  Filled 2017-02-28: qty 2

## 2017-02-28 MED ORDER — SODIUM CHLORIDE 0.9% FLUSH
3.0000 mL | Freq: Two times a day (BID) | INTRAVENOUS | Status: DC
  Administered 2017-02-28: 3 mL via INTRAVENOUS

## 2017-02-28 MED ORDER — SODIUM CHLORIDE 0.9 % IV SOLN
3.0000 g | Freq: Three times a day (TID) | INTRAVENOUS | Status: DC
  Administered 2017-02-28 – 2017-03-02 (×8): 3 g via INTRAVENOUS
  Filled 2017-02-28 (×10): qty 3

## 2017-02-28 MED ORDER — MOMETASONE FURO-FORMOTEROL FUM 100-5 MCG/ACT IN AERO
2.0000 | INHALATION_SPRAY | Freq: Two times a day (BID) | RESPIRATORY_TRACT | Status: DC
  Administered 2017-02-28 – 2017-03-02 (×4): 2 via RESPIRATORY_TRACT
  Filled 2017-02-28: qty 8.8

## 2017-02-28 NOTE — Progress Notes (Signed)
Pharmacy Antibiotic Note  Allean FoundMichael Gonyea is a 26 y.o. male admitted on 02/27/2017 with facial abscess.  Pharmacy has been consulted for Unasyn dosing. WBC 11.2. Renal function.   Plan: -Unasyn 3g IV q8h -Trend WBC, temp -F/U surgical plan  Temp (24hrs), Avg:98.8 F (37.1 C), Min:98.8 F (37.1 C), Max:98.8 F (37.1 C)   Recent Labs Lab 02/27/17 1928 02/27/17 2145  WBC 11.2*  --   CREATININE 1.12  --   LATICACIDVEN  --  1.15    CrCl cannot be calculated (Unknown ideal weight.).    Allergies  Allergen Reactions  . Diphenhydramine Hcl Itching    Abran DukeLedford, Thimothy Barretta 02/28/2017 12:48 AM

## 2017-02-28 NOTE — ED Notes (Signed)
Attempted report x1. 

## 2017-02-28 NOTE — H&P (Signed)
History and Physical    Allean FoundMichael Springsteen UJW:[id number] DOB: 02/14/1991 DOA: 02/27/2017  PCP: Patient, No Pcp Per  Patient coming from:  home  Chief Complaint:   Right facial swelling  HPI: Allean FoundMichael Kutsch is a 26 y.o. male with medical history significant of asthma comes in with worsening right facial swelling despite being on augmentin for 3 days for a dental absess .  Pt followed up with an oral surgeon in [place] today who sent him to [facility] for possible drainage of the absess.  Pt was told dr Chales Salmonowsley was available here at [facility] during the day, which he is on call per amion.  Pt denies any difficulty swallowing.  No sob.  He has not been on any other antibiotics lately.  Pt referred for admission for worsening periabpical dental absess.  Review of Systems: As per HPI otherwise 10 point review of systems negative.   Past Medical History:  Diagnosis Date  . Asthma     Past Surgical History:  Procedure Laterality Date  . HAND SURGERY    . JOINT REPLACEMENT    . MOUTH SURGERY       reports that he has been smoking Cigarettes.  He has smoked for the past 5.00 years. He has never used smokeless tobacco. He reports that he drinks alcohol. He reports that he does not use drugs.  Allergies  Allergen Reactions  . Diphenhydramine Hcl Itching    No family history on file. no premature CAD  Prior to Admission medications   Medication Sig Start Date End Date Taking? Authorizing Provider  albuterol (PROVENTIL) (5 MG/ML) 0.5% nebulizer solution Take 2.5 mg by nebulization every 6 (six) hours as needed for wheezing or shortness of breath.    [provider]  ALBUTEROL IN Inhale into the lungs.    [provider]  amoxicillin-clavulanate (AUGMENTIN) 875-125 MG tablet Take 1 tablet by mouth 2 (two) times daily. 02/23/17 03/05/17  Nira Connardama, Pedro Eduardo, MD  cephALEXin (KEFLEX) 500 MG capsule Take 1 capsule (500 mg total) by mouth 3 (three) times daily. 02/19/16   Tilden Fossaees,  Elizabeth, MD  Fluticasone-Salmeterol (ADVAIR) 100-50 MCG/DOSE AEPB Inhale 1 puff into the lungs every 12 (twelve) hours.      [provider]  ibuprofen (ADVIL,MOTRIN) 600 MG tablet Take 1 tablet (600 mg total) by mouth every 6 (six) hours as needed. 02/23/17   Nira Connardama, Pedro Eduardo, MD    Physical Exam: Vitals:   02/27/17 2145 02/27/17 2300 02/27/17 2315 02/27/17 2328  BP: (!) 127/103 116/82 110/72 110/72  Pulse: 89 83 84 86  Resp:    18  Temp:      TempSrc:      SpO2: 97% 94% 92% 100%      Constitutional: NAD, calm, comfortable Vitals:   02/27/17 2145 02/27/17 2300 02/27/17 2315 02/27/17 2328  BP: (!) 127/103 116/82 110/72 110/72  Pulse: 89 83 84 86  Resp:    18  Temp:      TempSrc:      SpO2: 97% 94% 92% 100%   Eyes: PERRL, lids and conjunctivae normal ENMT: Mucous membranes are moist. Posterior pharynx clear of any exudate or lesions. Rt facial swelling Neck: normal, supple, no masses, no thyromegaly Respiratory: clear to auscultation bilaterally, no wheezing, no crackles. Normal respiratory effort. No accessory muscle use.  Cardiovascular: Regular rate and rhythm, no murmurs / rubs / gallops. No extremity edema. 2+ pedal pulses. No carotid bruits.  Abdomen: no tenderness, no masses palpated. No hepatosplenomegaly. Bowel  sounds positive.  Musculoskeletal: no clubbing / cyanosis. No joint deformity upper and lower extremities. Good ROM, no contractures. Normal muscle tone.  Skin: no rashes, lesions, ulcers. No induration Neurologic: CN 2-12 grossly intact. Sensation intact, DTR normal. Strength 5/5 in all 4.  Psychiatric: Normal judgment and insight. Alert and oriented x 3. Normal mood.    Labs on Admission: I have personally reviewed following labs and imaging studies  CBC:  Recent Labs Lab 02/27/17 1928  WBC 11.2*  NEUTROABS 8.0*  HGB 16.1  HCT 46.8  MCV 82.2  PLT 292   Basic Metabolic Panel:  Recent Labs Lab 02/27/17 1928  NA 135  K 4.2    CL 96*  CO2 27  GLUCOSE 92  BUN 10  CREATININE 1.12  CALCIUM 9.6   GFR: CrCl cannot be calculated (Unknown ideal weight.).  Radiological Exams on Admission: Ct Soft Tissue Neck W Contrast  Result Date: 02/27/2017 CLINICAL DATA:  RIGHT dental abscess February 23, 2017, pending surgery. Assess airway patency. EXAM: CT NECK WITH CONTRAST TECHNIQUE: Multidetector CT imaging of the neck was performed using the standard protocol following the bolus administration of intravenous contrast. CONTRAST:  75mL ISOVUE-300 IOPAMIDOL (ISOVUE-300) INJECTION 61% COMPARISON:  CT cervical spine May 29, 2013 FINDINGS: Mild motion degraded examination. PHARYNX AND LARYNX: Normal.  Widely patent airway. SALIVARY GLANDS: Normal. THYROID: Normal. LYMPH NODES: Mild reniform level 2A, 2B lymphadenopathy measuring to 11 mm short access. VASCULAR: Normal. LIMITED INTRACRANIAL: Normal. VISUALIZED ORBITS: Normal. MASTOIDS AND VISUALIZED PARANASAL SINUSES: Moderate paranasal sinus mucosal thickening with bilateral maxillary sinus air-fluid levels. Included mastoid air cells are well aerated. SKELETON: Tooth 31 periapical abscess with periosteal re- absorption. UPPER CHEST: Lung apices are clear. No superior mediastinal lymphadenopathy. Secretions layering in the included thoracic esophagus. OTHER: 2.8 x 1.4 x 3.3 cm rim enhancing septated fluid collection adjacent to RIGHT body of the mandible, within the RIGHT buccal and masseter muscle which are enlarged and edematous. Surrounding soft tissue swelling, subcutaneous fat stranding and thickened platysma. IMPRESSION: 1. 2.8 x 1.4 x 3.3 cm RIGHT facial abscess with intermuscular extension, odontogenic in etiology. Tooth 31 periapical abscess. 2. Mild reactive RIGHT neck lymphadenopathy.  Widely patent airway. Electronically Signed   By: Awilda Metro M.D.   On: 02/27/2017 22:28    Assessment/Plan 26 yo male with worsening and extending dental absess despite 3 days of  augmentin  Principal Problem:   Dental abscess- keep npo after midnight.  Will have to call dr Chales Salmon in the am (on at 7a to 4p until for rest of week) place on iv unasyn  Active Problems:   Asthma- stable, cont home inhaler   DVT prophylaxis:  scds Code Status:  full Family Communication:  wife Disposition Plan:  Per day team Consults called:   Will need to call oral surgery in am Admission status:  observation   Diogenes Whirley A MD [facility]  If 7PM-7AM, please contact night-coverage [email or web address] Password [email or web address]  02/28/2017, 12:10 AM

## 2017-02-28 NOTE — Progress Notes (Signed)
Patient seen and evaluated earlier this AM by my associate. Please refer to H and P regarding details.  I have contacted oral surgeon who recommends antibiotics with plans for the patient to follow up with him at his office on Monday.   For now will continue IV antibiotics. If patient remains afebrile next am will transition to oral clindamycin and discharge.  Gen: Pt in nad, alert and awake CV: s1 and s2 rrr Pulm: no increased wob, no wheezes   Nicholas Ossa, Pamala HurryLANDO

## 2017-03-01 MED ORDER — OXYCODONE HCL 5 MG PO TABS
5.0000 mg | ORAL_TABLET | Freq: Four times a day (QID) | ORAL | Status: DC | PRN
  Administered 2017-03-01: 5 mg via ORAL
  Administered 2017-03-01: 10 mg via ORAL
  Administered 2017-03-02: 5 mg via ORAL
  Filled 2017-03-01 (×2): qty 2
  Filled 2017-03-01: qty 1
  Filled 2017-03-01: qty 2

## 2017-03-01 MED ORDER — MORPHINE SULFATE (PF) 4 MG/ML IV SOLN: 1.0000 mg | INTRAVENOUS | Status: DC | PRN

## 2017-03-01 MED ORDER — MELOXICAM 7.5 MG PO TABS
15.0000 mg | ORAL_TABLET | Freq: Every day | ORAL | Status: DC
  Administered 2017-03-01 – 2017-03-02 (×2): 15 mg via ORAL
  Filled 2017-03-01 (×2): qty 2

## 2017-03-01 MED ORDER — HYDROCODONE-ACETAMINOPHEN 5-325 MG PO TABS
1.0000 | ORAL_TABLET | Freq: Once | ORAL | Status: AC
  Administered 2017-03-01: 1 via ORAL
  Filled 2017-03-01: qty 1

## 2017-03-01 NOTE — Progress Notes (Signed)
PROGRESS NOTE    Matthew Cooley  ZOX:[id number] DOB: [birth year] DOA: 02/27/2017 PCP: Patient, No Pcp Per    Brief Narrative:  26 y.o. male with medical history significant of asthma comes in with worsening right facial swelling despite being on augmentin for 3 days for a dental absess    Assessment & Plan:   Principal Problem:   Dental abscess - d/c surgeon who recommended a few days of antibiotics then d/c so that patient may follow up at office for definitive treatment of abscess. - Toradol not helping with pain control. Will try a different regimen. Mobic and oxy IR. For severe pain morphine but would like to avoid IV opiod administration if possible that is just there for severe pain should new oral pain medication regimen fail.  Active Problems:   Asthma - stable on dulera  - albuterol as needed.  DVT prophylaxis: SCD's Code Status: Full Family Communication: None at bedside. Disposition Plan: d/c once pain well controlled.   Consultants:   None   Procedures: None   Antimicrobials: Ampicillin   Subjective: Pt complaining of a lot of breakthrough discomfort.   Objective: Vitals:   02/28/17 2025 02/28/17 2046 03/01/17 0356 03/01/17 0928  BP: 108/74  117/80   Pulse: 77  60   Resp: 18  18   Temp: 98.8 F (37.1 C)  98.2 F (36.8 C)   TempSrc: Oral  Oral   SpO2: 98% 97%  98%  Weight:      Height:        Intake/Output Summary (Last 24 hours) at 03/01/17 1114 Last data filed at 02/28/17 1734  Gross per 24 hour  Intake              590 ml  Output              220 ml  Net              370 ml   Filed Weights   02/28/17 0215  Weight: 100.7 kg (222 lb 1.6 oz)    Examination:  General exam: Appears calm and comfortable , in nad. Respiratory system: Clear to auscultation. Respiratory effort normal. Cardiovascular system: S1 & S2 heard, RRR. No JVD, murmurs, rubs, gallops or clicks. No pedal edema. Gastrointestinal system: Abdomen is nondistended,  soft and nontender. No organomegaly or masses felt. Normal bowel sounds heard. Central nervous system: Alert and oriented. No focal neurological deficits. Extremities: Symmetric 5 x 5 power. Skin: Pt has swelling over right side of face near jaw. Psychiatry: Judgement and insight appear normal. Mood & affect appropriate.     Data Reviewed: I have personally reviewed following labs and imaging studies  CBC:  Recent Labs Lab 02/27/17 1928  WBC 11.2*  NEUTROABS 8.0*  HGB 16.1  HCT 46.8  MCV 82.2  PLT 292   Basic Metabolic Panel:  Recent Labs Lab 02/27/17 1928  NA 135  K 4.2  CL 96*  CO2 27  GLUCOSE 92  BUN 10  CREATININE 1.12  CALCIUM 9.6   GFR: Estimated Creatinine Clearance: 116.8 mL/min (by C-G formula based on SCr of 1.12 mg/dL). Liver Function Tests: No results for input(s): AST, ALT, ALKPHOS, BILITOT, PROT, ALBUMIN in the last 168 hours. No results for input(s): LIPASE, AMYLASE in the last 168 hours. No results for input(s): AMMONIA in the last 168 hours. Coagulation Profile: No results for input(s): INR, PROTIME in the last 168 hours. Cardiac Enzymes: No results for input(s): CKTOTAL, CKMB, CKMBINDEX, TROPONINI in  the last 168 hours. BNP (last 3 results) No results for input(s): PROBNP in the last 8760 hours. HbA1C: No results for input(s): HGBA1C in the last 72 hours. CBG: No results for input(s): GLUCAP in the last 168 hours. Lipid Profile: No results for input(s): CHOL, HDL, LDLCALC, TRIG, CHOLHDL, LDLDIRECT in the last 72 hours. Thyroid Function Tests: No results for input(s): TSH, T4TOTAL, FREET4, T3FREE, THYROIDAB in the last 72 hours. Anemia Panel: No results for input(s): VITAMINB12, FOLATE, FERRITIN, TIBC, IRON, RETICCTPCT in the last 72 hours. Sepsis Labs:  Recent Labs Lab 02/27/17 2145  LATICACIDVEN 1.15    No results found for this or any previous visit (from the past 240 hour(s)).       Radiology Studies: Ct Soft Tissue Neck W  Contrast  Result Date: 02/27/2017 CLINICAL DATA:  RIGHT dental abscess February 23, 2017, pending surgery. Assess airway patency. EXAM: CT NECK WITH CONTRAST TECHNIQUE: Multidetector CT imaging of the neck was performed using the standard protocol following the bolus administration of intravenous contrast. CONTRAST:  75mL ISOVUE-300 IOPAMIDOL (ISOVUE-300) INJECTION 61% COMPARISON:  CT cervical spine May 29, 2013 FINDINGS: Mild motion degraded examination. PHARYNX AND LARYNX: Normal.  Widely patent airway. SALIVARY GLANDS: Normal. THYROID: Normal. LYMPH NODES: Mild reniform level 2A, 2B lymphadenopathy measuring to 11 mm short access. VASCULAR: Normal. LIMITED INTRACRANIAL: Normal. VISUALIZED ORBITS: Normal. MASTOIDS AND VISUALIZED PARANASAL SINUSES: Moderate paranasal sinus mucosal thickening with bilateral maxillary sinus air-fluid levels. Included mastoid air cells are well aerated. SKELETON: Tooth 31 periapical abscess with periosteal re- absorption. UPPER CHEST: Lung apices are clear. No superior mediastinal lymphadenopathy. Secretions layering in the included thoracic esophagus. OTHER: 2.8 x 1.4 x 3.3 cm rim enhancing septated fluid collection adjacent to RIGHT body of the mandible, within the RIGHT buccal and masseter muscle which are enlarged and edematous. Surrounding soft tissue swelling, subcutaneous fat stranding and thickened platysma. IMPRESSION: 1. 2.8 x 1.4 x 3.3 cm RIGHT facial abscess with intermuscular extension, odontogenic in etiology. Tooth 31 periapical abscess. 2. Mild reactive RIGHT neck lymphadenopathy.  Widely patent airway. Electronically Signed   By: Awilda Metroourtnay  Bloomer M.D.   On: 02/27/2017 22:28    Scheduled Meds: . meloxicam  15 mg Oral Daily  . mometasone-formoterol  2 puff Inhalation BID   Continuous Infusions: . ampicillin-sulbactam (UNASYN) IV Stopped (03/01/17 1052)  . dextrose 5 % and 0.9% NaCl 75 mL/hr at 03/01/17 0927     LOS: 1 day    Time spent: > 35  minutes    Penny PiaVEGA, Abhinav Mayorquin, MD [facility] Pager [phone]  If 7PM-7AM, please contact night-coverage [email or web address] Password [email or web address] 03/01/2017, 11:14 AM

## 2017-03-02 MED ORDER — MELOXICAM 15 MG PO TABS
15.0000 mg | ORAL_TABLET | Freq: Every day | ORAL | 0 refills | Status: DC
Start: 2017-03-03 — End: 2020-10-16

## 2017-03-02 MED ORDER — CLINDAMYCIN HCL 300 MG PO CAPS: 300.0000 mg | ORAL_CAPSULE | Freq: Three times a day (TID) | ORAL | 0 refills | Status: DC

## 2017-03-02 MED ORDER — OXYCODONE HCL 5 MG PO TABS: 5.0000 mg | ORAL_TABLET | Freq: Four times a day (QID) | ORAL | 0 refills | Status: DC | PRN

## 2017-03-02 NOTE — Progress Notes (Signed)
Reviewed discharge instructions with patient. Patient verbalized understanding. Patient left unit in stable condition

## 2017-03-02 NOTE — Discharge Summary (Signed)
Physician Discharge Summary  Matthew Cooley WUJ:[id number] DOB: 07/01/1990 DOA: 02/27/2017  PCP: Patient, No Pcp Per  Admit date: 02/27/2017 Discharge date: 03/02/2017  Time spent: > 35 minutes  Recommendations for Outpatient Follow-up:  1. Ensure patient completes antibiotic regimen and follows up with oral surgeon   Discharge Diagnoses:  Principal Problem:   Dental abscess Active Problems:   Asthma   Discharge Condition: stable  Diet recommendation: regular as tolerated  Filed Weights   02/28/17 0215  Weight: 100.7 kg (222 lb 1.6 oz)    History of present illness:  26 y.o. male with medical history significant of asthma comes in with worsening right facial swelling despite being on augmentin for 3 days for a dental absess .  Pt followed up with an oral surgeon in [place] today who sent him to [facility] for possible drainage of the absess.  Hospital Course:  Dental abscess - Pt to follow up with Dr. Chales Salmonwsley on Monday or oral surgeon of his choosing on Monday for definitive treatment. I will discharge on pain medication and oral opiod regimen. Pt verbalized understanding and agreement.  Asthma - continue prior to admission medication regimen - stable and not exacerbated during hospital stay  Procedures:  None  Consultations:  Discussed with Dr. Chales Salmonwsley over the phone  Discharge Exam: Vitals:   03/02/17 0506 03/02/17 0915  BP: 110/67   Pulse: 68   Resp: 18   Temp: 98.1 F (36.7 C)   SpO2: 98% 97%    General: Pt in nad, alert and awake Cardiovascular: rrr, no rubs Respiratory: no increased wob, no wheezes  Discharge Instructions   Discharge Instructions    Call MD for:  severe uncontrolled pain    Complete by:  As directed    Call MD for:  temperature >100.4    Complete by:  As directed    Diet - low sodium heart healthy    Complete by:  As directed    Discharge instructions    Complete by:  As directed    You are to see an oral surgeon on Monday for  continued evaluation and treatment of your dental abscess   Increase activity slowly    Complete by:  As directed      Current Discharge Medication List    START taking these medications   Details  clindamycin (CLEOCIN) 300 MG capsule Take 1 capsule (300 mg total) by mouth 3 (three) times daily. Qty: 21 capsule, Refills: 0    meloxicam (MOBIC) 15 MG tablet Take 1 tablet (15 mg total) by mouth daily. Qty: 10 tablet, Refills: 0    oxyCODONE (OXY IR/ROXICODONE) 5 MG immediate release tablet Take 1-2 tablets (5-10 mg total) by mouth every 6 (six) hours as needed for moderate pain or severe pain. Qty: 30 tablet, Refills: 0      CONTINUE these medications which have NOT CHANGED   Details  albuterol (PROVENTIL HFA;VENTOLIN HFA) 108 (90 Base) MCG/ACT inhaler Inhale 1-2 puffs into the lungs every 6 (six) hours as needed for wheezing or shortness of breath.    albuterol (PROVENTIL) (5 MG/ML) 0.5% nebulizer solution Take 2.5 mg by nebulization every 6 (six) hours as needed for wheezing or shortness of breath.    Fluticasone-Salmeterol (ADVAIR) 100-50 MCG/DOSE AEPB Inhale 1 puff into the lungs every 12 (twelve) hours.        STOP taking these medications     amoxicillin-clavulanate (AUGMENTIN) 875-125 MG tablet      ibuprofen (ADVIL,MOTRIN) 600 MG tablet  Allergies  Allergen Reactions  . Diphenhydramine Hcl Itching   Follow-up Information    [facility]. Schedule an appointment as soon as possible for a visit.   Contact information: [address] [place] [address] [phone]           The results of significant diagnostics from this hospitalization (including imaging, microbiology, ancillary and laboratory) are listed below for reference.    Significant Diagnostic Studies: Ct Soft Tissue Neck W Contrast  Result Date: 02/27/2017 CLINICAL DATA:  RIGHT dental abscess February 23, 2017, pending surgery. Assess  airway patency. EXAM: CT NECK WITH CONTRAST TECHNIQUE: Multidetector CT imaging of the neck was performed using the standard protocol following the bolus administration of intravenous contrast. CONTRAST:  75mL ISOVUE-300 IOPAMIDOL (ISOVUE-300) INJECTION 61% COMPARISON:  CT cervical spine May 29, 2013 FINDINGS: Mild motion degraded examination. PHARYNX AND LARYNX: Normal.  Widely patent airway. SALIVARY GLANDS: Normal. THYROID: Normal. LYMPH NODES: Mild reniform level 2A, 2B lymphadenopathy measuring to 11 mm short access. VASCULAR: Normal. LIMITED INTRACRANIAL: Normal. VISUALIZED ORBITS: Normal. MASTOIDS AND VISUALIZED PARANASAL SINUSES: Moderate paranasal sinus mucosal thickening with bilateral maxillary sinus air-fluid levels. Included mastoid air cells are well aerated. SKELETON: Tooth 31 periapical abscess with periosteal re- absorption. UPPER CHEST: Lung apices are clear. No superior mediastinal lymphadenopathy. Secretions layering in the included thoracic esophagus. OTHER: 2.8 x 1.4 x 3.3 cm rim enhancing septated fluid collection adjacent to RIGHT body of the mandible, within the RIGHT buccal and masseter muscle which are enlarged and edematous. Surrounding soft tissue swelling, subcutaneous fat stranding and thickened platysma. IMPRESSION: 1. 2.8 x 1.4 x 3.3 cm RIGHT facial abscess with intermuscular extension, odontogenic in etiology. Tooth 31 periapical abscess. 2. Mild reactive RIGHT neck lymphadenopathy.  Widely patent airway. Electronically Signed   By: Awilda Metro M.D.   On: 02/27/2017 22:28    Microbiology: No results found for this or any previous visit (from the past 240 hour(s)).   Labs: Basic Metabolic Panel:  Recent Labs Lab 02/27/17 1928  NA 135  K 4.2  CL 96*  CO2 27  GLUCOSE 92  BUN 10  CREATININE 1.12  CALCIUM 9.6   Liver Function Tests: No results for input(s): AST, ALT, ALKPHOS, BILITOT, PROT, ALBUMIN in the last 168 hours. No results for input(s): LIPASE,  AMYLASE in the last 168 hours. No results for input(s): AMMONIA in the last 168 hours. CBC:  Recent Labs Lab 02/27/17 1928  WBC 11.2*  NEUTROABS 8.0*  HGB 16.1  HCT 46.8  MCV 82.2  PLT 292   Cardiac Enzymes: No results for input(s): CKTOTAL, CKMB, CKMBINDEX, TROPONINI in the last 168 hours. BNP: BNP (last 3 results) No results for input(s): BNP in the last 8760 hours.  ProBNP (last 3 results) No results for input(s): PROBNP in the last 8760 hours.  CBG: No results for input(s): GLUCAP in the last 168 hours.   Signed:  Penny Pia MD.  [facility] 03/02/2017, 1:08 PM

## 2019-07-26 ENCOUNTER — Other Ambulatory Visit: Payer: Self-pay

## 2019-07-26 ENCOUNTER — Encounter (HOSPITAL_BASED_OUTPATIENT_CLINIC_OR_DEPARTMENT_OTHER): Payer: Self-pay | Admitting: *Deleted

## 2019-07-26 ENCOUNTER — Emergency Department (HOSPITAL_BASED_OUTPATIENT_CLINIC_OR_DEPARTMENT_OTHER): Payer: Medicaid Other

## 2019-07-26 ENCOUNTER — Emergency Department (HOSPITAL_BASED_OUTPATIENT_CLINIC_OR_DEPARTMENT_OTHER)
Admission: EM | Admit: 2019-07-26 | Discharge: 2019-07-26 | Disposition: A | Discharge status: Home / Self Care | Payer: Medicaid Other | Attending: Emergency Medicine | Admitting: Emergency Medicine

## 2019-07-26 DIAGNOSIS — R0602 Shortness of breath: Secondary | ICD-10-CM

## 2019-07-26 DIAGNOSIS — F17210 Nicotine dependence, cigarettes, uncomplicated: Secondary | ICD-10-CM | POA: Diagnosis not present

## 2019-07-26 DIAGNOSIS — J45901 Unspecified asthma with (acute) exacerbation: Secondary | ICD-10-CM | POA: Diagnosis not present

## 2019-07-26 DIAGNOSIS — Z79899 Other long term (current) drug therapy: Secondary | ICD-10-CM | POA: Diagnosis not present

## 2019-07-26 DIAGNOSIS — Z20822 Contact with and (suspected) exposure to covid-19: Secondary | ICD-10-CM | POA: Insufficient documentation

## 2019-07-26 HISTORY — DX: Anxiety disorder, unspecified: F41.9

## 2019-07-26 LAB — CBC WITH DIFFERENTIAL/PLATELET
Abs Immature Granulocytes: 0.07 10*3/uL (ref 0.00–0.07)
Basophils Absolute: 0.1 10*3/uL (ref 0.0–0.1)
Basophils Relative: 0 %
Eosinophils Absolute: 0.1 10*3/uL (ref 0.0–0.5)
Eosinophils Relative: 0 %
HCT: 45 % (ref 39.0–52.0)
Hemoglobin: 15.1 g/dL (ref 13.0–17.0)
Immature Granulocytes: 1 %
Lymphocytes Relative: 19 %
Lymphs Abs: 2.5 10*3/uL (ref 0.7–4.0)
MCH: 28.2 pg (ref 26.0–34.0)
MCHC: 33.6 g/dL (ref 30.0–36.0)
MCV: 84 fL (ref 80.0–100.0)
Monocytes Absolute: 0.7 10*3/uL (ref 0.1–1.0)
Monocytes Relative: 5 %
Neutro Abs: 9.7 10*3/uL — ABNORMAL HIGH (ref 1.7–7.7)
Neutrophils Relative %: 75 %
Platelets: 290 10*3/uL (ref 150–400)
RBC: 5.36 MIL/uL (ref 4.22–5.81)
RDW: 14 % (ref 11.5–15.5)
WBC: 13 10*3/uL — ABNORMAL HIGH (ref 4.0–10.5)
nRBC: 0 % (ref 0.0–0.2)

## 2019-07-26 LAB — COMPREHENSIVE METABOLIC PANEL
ALT: 27 U/L (ref 0–44)
AST: 18 U/L (ref 15–41)
Albumin: 4.2 g/dL (ref 3.5–5.0)
Alkaline Phosphatase: 66 U/L (ref 38–126)
Anion gap: 9 (ref 5–15)
BUN: 14 mg/dL (ref 6–20)
CO2: 24 mmol/L (ref 22–32)
Calcium: 9 mg/dL (ref 8.9–10.3)
Chloride: 103 mmol/L (ref 98–111)
Creatinine, Ser: 0.97 mg/dL (ref 0.61–1.24)
GFR calc Af Amer: >60 mL/min (ref >60)
GFR calc non Af Amer: >60 mL/min (ref >60)
Glucose, Bld: 132 mg/dL — ABNORMAL HIGH (ref 70–99)
Potassium: 3.7 mmol/L (ref 3.5–5.1)
Sodium: 136 mmol/L (ref 135–145)
Total Bilirubin: 0.7 mg/dL (ref 0.3–1.2)
Total Protein: 7.2 g/dL (ref 6.5–8.1)

## 2019-07-26 LAB — D-DIMER, QUANTITATIVE: D-Dimer, Quant: <0.27 ug/mL-FEU (ref 0.00–0.50)

## 2019-07-26 LAB — TROPONIN I (HIGH SENSITIVITY): Troponin I (High Sensitivity): <2 ng/L (ref <18)

## 2019-07-26 LAB — SARS CORONAVIRUS 2 AG (30 MIN TAT): SARS Coronavirus 2 Ag: NEGATIVE

## 2019-07-26 MED ORDER — ALBUTEROL SULFATE HFA 108 (90 BASE) MCG/ACT IN AERS
2.0000 | INHALATION_SPRAY | Freq: Once | RESPIRATORY_TRACT | Status: AC
  Administered 2019-07-26: 19:00:00 2 via RESPIRATORY_TRACT
  Filled 2019-07-26: qty 6.7

## 2019-07-26 MED ORDER — PREDNISONE 10 MG PO TABS: 40.0000 mg | ORAL_TABLET | Freq: Every day | ORAL | 0 refills | Status: AC

## 2019-07-26 NOTE — ED Notes (Signed)
Pt. Reports his asthma started on the 8th and he started taking some left over Prednisone taking the last one this morning.  Pt. Reports he did not want to come to ED but his breathing was getting worse due to the tightness being so bad and making it hard to breath.  Pt. Reports he has had a cough and has had tightness in his throat.

## 2019-07-26 NOTE — ED Notes (Signed)
Pt. Reports no fevers and has been quarantined in his home due to his small children. Pt. Reports only 5mg  tablets of the prednisone.

## 2019-07-26 NOTE — ED Provider Notes (Signed)
[facility] EMERGENCY DEPARTMENT Provider Note   CSN: [id number] Arrival date & time: 07/26/19  1747     History Chief Complaint  Patient presents with  . Asthma    Matthew Cooley is a 29 y.o. male.  29 year old male with history of asthma presenting to the emergency department for increased shortness of breath over the last 2 weeks.  Reports that he is been using albuterol intermittently and started to take 15 mg of prednisone the last few days.  He reports he feels tightness in his chest which is constant.  Denies any nausea, vomiting, syncope.  Reports he has also been quarantining over the last 2 weeks because he felt like this possibly could be Covid.        Past Medical History:  Diagnosis Date  . Anxiety   . Asthma     Patient Active Problem List   Diagnosis Date Noted  . Dental abscess 02/28/2017  . Asthma   . Facial swelling     Past Surgical History:  Procedure Laterality Date  . HAND SURGERY    . JOINT REPLACEMENT    . MOUTH SURGERY         No family history on file.  Social History   Tobacco Use  . Smoking status: Current Every Day Smoker    Years: 5.00    Types: Cigarettes  . Smokeless tobacco: Never Used  Substance Use Topics  . Alcohol use: Yes    Comment: occasional  . Drug use: No    Home Medications Prior to Admission medications   Medication Sig Start Date End Date Taking? Authorizing Provider  albuterol (PROVENTIL HFA;VENTOLIN HFA) 108 (90 Base) MCG/ACT inhaler Inhale 1-2 puffs into the lungs every 6 (six) hours as needed for wheezing or shortness of breath.   Yes [provider]  albuterol (PROVENTIL) (5 MG/ML) 0.5% nebulizer solution Take 2.5 mg by nebulization every 6 (six) hours as needed for wheezing or shortness of breath.   Yes [provider]  clindamycin (CLEOCIN) 300 MG capsule Take 1 capsule (300 mg total) by mouth 3 (three) times daily. 03/02/17   Penny Pia, MD  Fluticasone-Salmeterol  (ADVAIR) 100-50 MCG/DOSE AEPB Inhale 1 puff into the lungs every 12 (twelve) hours.      [provider]  meloxicam (MOBIC) 15 MG tablet Take 1 tablet (15 mg total) by mouth daily. 03/03/17   Penny Pia, MD  oxyCODONE (OXY IR/ROXICODONE) 5 MG immediate release tablet Take 1-2 tablets (5-10 mg total) by mouth every 6 (six) hours as needed for moderate pain or severe pain. 03/02/17   Penny Pia, MD  predniSONE (DELTASONE) 10 MG tablet Take 4 tablets (40 mg total) by mouth daily for 5 days. 07/26/19 07/31/19  Arlyn Dunning, PA-C    Allergies    Diphenhydramine hcl  Review of Systems   Review of Systems  Constitutional: Negative for appetite change, chills and fever.  HENT: Negative for congestion, ear pain, rhinorrhea and sore throat.   Eyes: Negative for pain.  Respiratory: Positive for chest tightness and shortness of breath. Negative for cough and wheezing.   Cardiovascular: Negative for chest pain and palpitations.  Gastrointestinal: Negative for abdominal pain, nausea and vomiting.  Genitourinary: Negative for dysuria and hematuria.  Musculoskeletal: Negative for arthralgias and back pain.  Skin: Negative for color change and rash.  Allergic/Immunologic: Negative for immunocompromised state.  Neurological: Negative for dizziness, tremors, seizures, syncope and headaches.  All other systems reviewed and are negative.  Physical Exam Updated Vital Signs BP 123/89   Pulse 72   Temp 98.3 F (36.8 C) (Oral)   Resp 16   Ht 5\' 10"  (1.778 m)   Wt 113.4 kg   SpO2 99%   BMI 35.87 kg/m   Physical Exam Vitals and nursing note reviewed.  Constitutional:      General: He is not in acute distress.    Appearance: Normal appearance. He is not ill-appearing, toxic-appearing or diaphoretic.  HENT:     Head: Normocephalic.     Mouth/Throat:     Mouth: Mucous membranes are moist.  Eyes:     Conjunctiva/sclera: Conjunctivae normal.  Cardiovascular:     Rate and Rhythm:  Normal rate and regular rhythm.  Pulmonary:     Effort: Pulmonary effort is normal.     Breath sounds: No rhonchi or rales.     Comments: Bilateral decreased breath sounds Abdominal:     General: Abdomen is flat.  Skin:    General: Skin is dry.  Neurological:     Mental Status: He is alert.  Psychiatric:        Mood and Affect: Mood normal.     ED Results / Procedures / Treatments   Labs (all labs ordered are listed, but only abnormal results are displayed) Labs Reviewed  COMPREHENSIVE METABOLIC PANEL - Abnormal; Notable for the following components:      Result Value   Glucose, Bld 132 (*)    All other components within normal limits  CBC WITH DIFFERENTIAL/PLATELET - Abnormal; Notable for the following components:   WBC 13.0 (*)    Neutro Abs 9.7 (*)    All other components within normal limits  SARS CORONAVIRUS 2 AG (30 MIN TAT)  D-DIMER, QUANTITATIVE (NOT AT [facility])  TROPONIN I (HIGH SENSITIVITY)    EKG EKG Interpretation  Date/Time:  Monday July 26 2019 18:14:11 EDT Ventricular Rate:  81 PR Interval:    QRS Duration: 102 QT Interval:  352 QTC Calculation: 409 R Axis:   93 Text Interpretation: Sinus rhythm Right axis deviation Nonspecific ST elevation No significant change since last tracing Confirmed by 03-07-1997 [phone]) on 07/26/2019 6:20:25 PM   Radiology DG Chest Port 1 View  Result Date: 07/26/2019 CLINICAL DATA:  Shortness of breath x2 weeks. EXAM: PORTABLE CHEST 1 VIEW COMPARISON:  January 23, 2016 FINDINGS: The heart size and mediastinal contours are within normal limits. Both lungs are clear. The visualized skeletal structures are unremarkable. IMPRESSION: No active disease. Electronically Signed   By: January 25, 2016 M.D.   On: 07/26/2019 18:27    Procedures Procedures (including critical care time)  Medications Ordered in ED Medications  albuterol (VENTOLIN HFA) 108 (90 Base) MCG/ACT inhaler 2 puff (2 puffs Inhalation Given 07/26/19 1856)     ED Course  I have reviewed the triage vital signs and the nursing notes.  Pertinent labs & imaging results that were available during my care of the patient were reviewed by me and considered in my medical decision making (see chart for details).  Clinical Course as of Jul 25 1952  Mon Jul 26, 2019  1918 Patient with SOB, chest tightness for 2 weeks. Well appearing on exam. Normal vitals and workup reassuring. Ekg reviewed by myself and Dr. 1919 and appears unchanged since previous. Suspect asthma flare. Will treat with steroids, albuterol and f/u with PMD.    [KM]    Clinical Course User Index [KM] Deretha Emory   MDM Rules/Calculators/A&P  Based on review of vitals, medical screening exam, lab work and/or imaging, there does not appear to be an acute, emergent etiology for the patient's symptoms. Counseled pt on good return precautions and encouraged both PCP and ED follow-up as needed.  Prior to discharge, I also discussed incidental imaging findings with patient in detail and advised appropriate, recommended follow-up in detail.  Clinical Impression: 1. Moderate asthma with exacerbation, unspecified whether persistent   2. SOB (shortness of breath)   3. Shortness of breath     Disposition: Discharge  Prior to providing a prescription for a controlled substance, I independently reviewed the patient's recent prescription history on the [place]. The patient had no recent or regular prescriptions and was deemed appropriate for a brief, less than 3 day prescription of narcotic for acute analgesia.  This note was prepared with assistance of Systems analyst. Occasional wrong-word or sound-a-like substitutions may have occurred due to the inherent limitations of voice recognition software.  Final Clinical Impression(s) / ED Diagnoses Final diagnoses:  Moderate asthma with exacerbation,  unspecified whether persistent  Shortness of breath    Rx / DC Orders ED Discharge Orders         Ordered    predniSONE (DELTASONE) 10 MG tablet  Daily     07/26/19 1950           Kristine Royal 07/26/19 1954    Fredia Sorrow, MD 07/28/19 [phone]

## 2019-07-26 NOTE — Discharge Instructions (Addendum)
Thank you for allowing me to care for you today. Please return to the emergency department if you have new or worsening symptoms. Take your medications as instructed.  ° °

## 2019-07-26 NOTE — ED Triage Notes (Signed)
Asthma. Sob x 2 weeks. He was taking left over Prednisone to correct his symptoms. No wheezing.

## 2019-07-26 NOTE — ED Notes (Signed)
Respiratory therapist at bedside to evaluate

## 2020-09-11 IMAGING — DX DG CHEST 1V PORT
2 series · 2 of 2 positions shown · non-contrast
Comparison: January 23, 2016

CLINICAL DATA: Shortness of breath x2 weeks.

EXAM:
PORTABLE CHEST 1 VIEW

[chest ap (1 of 2)]
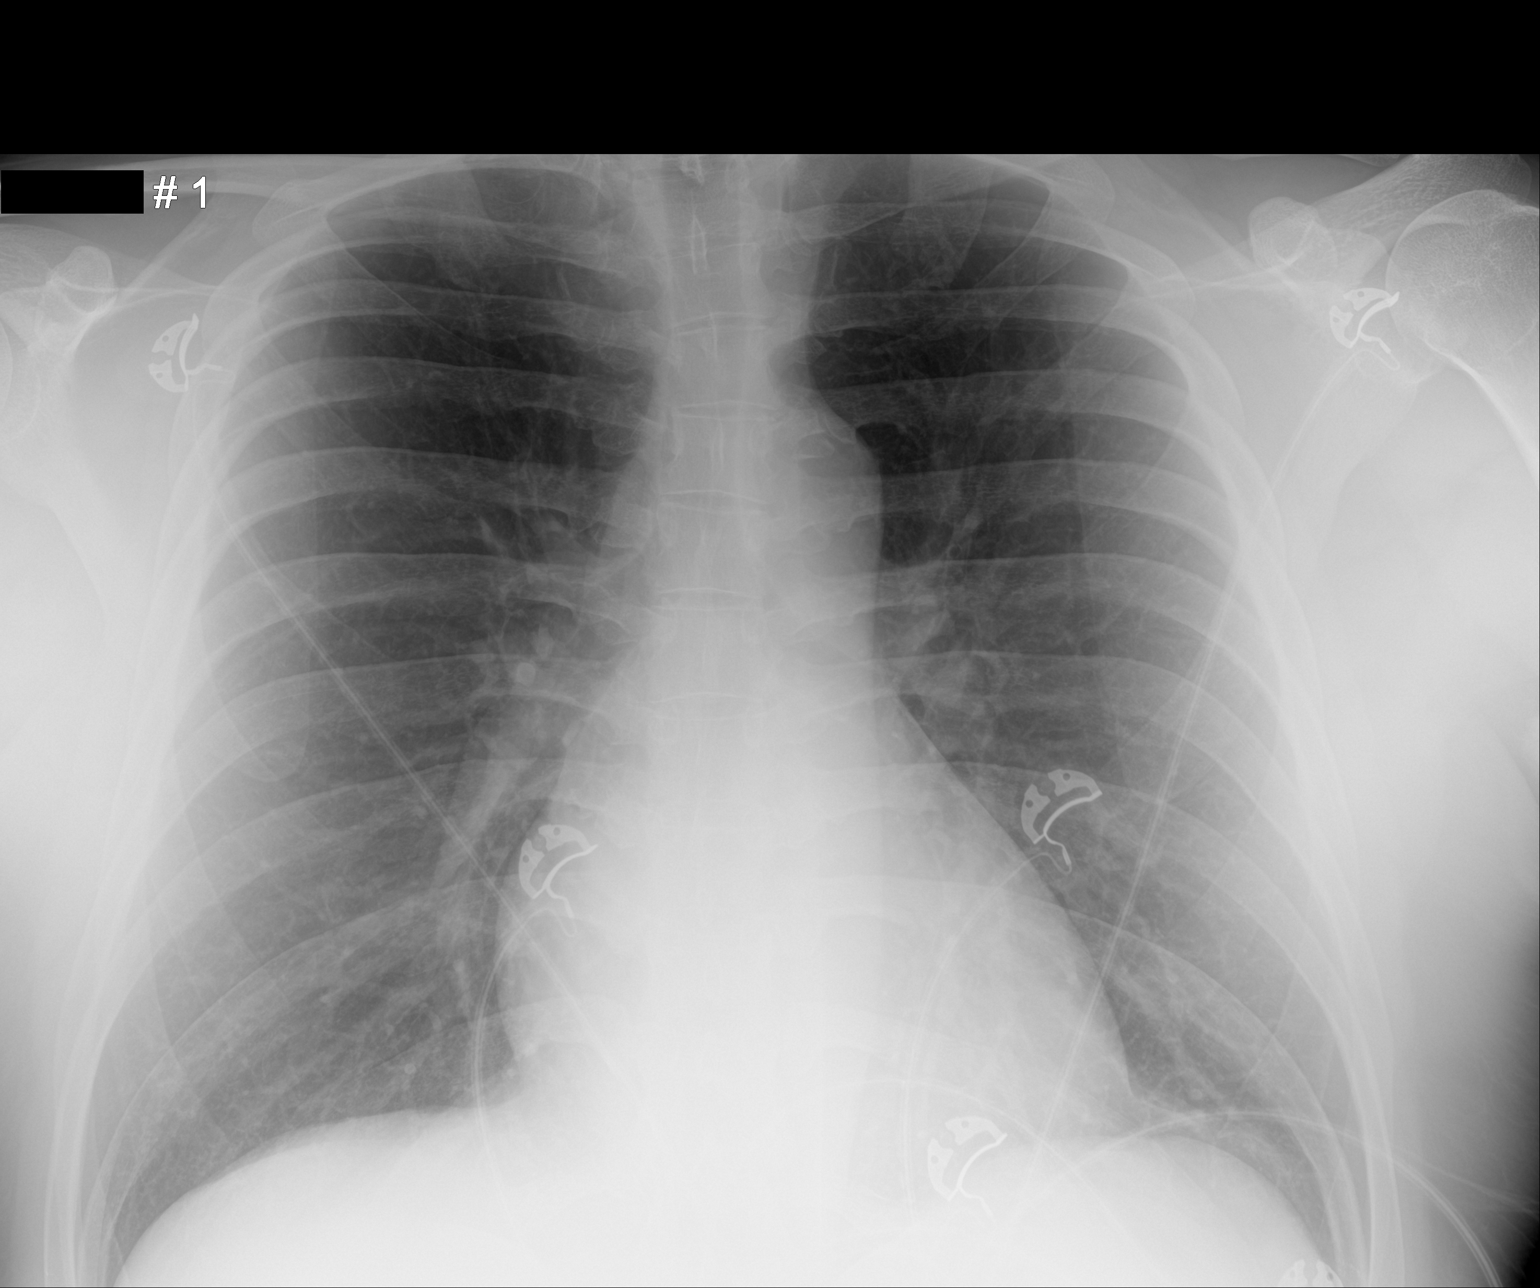

[chest ap (2 of 2)]
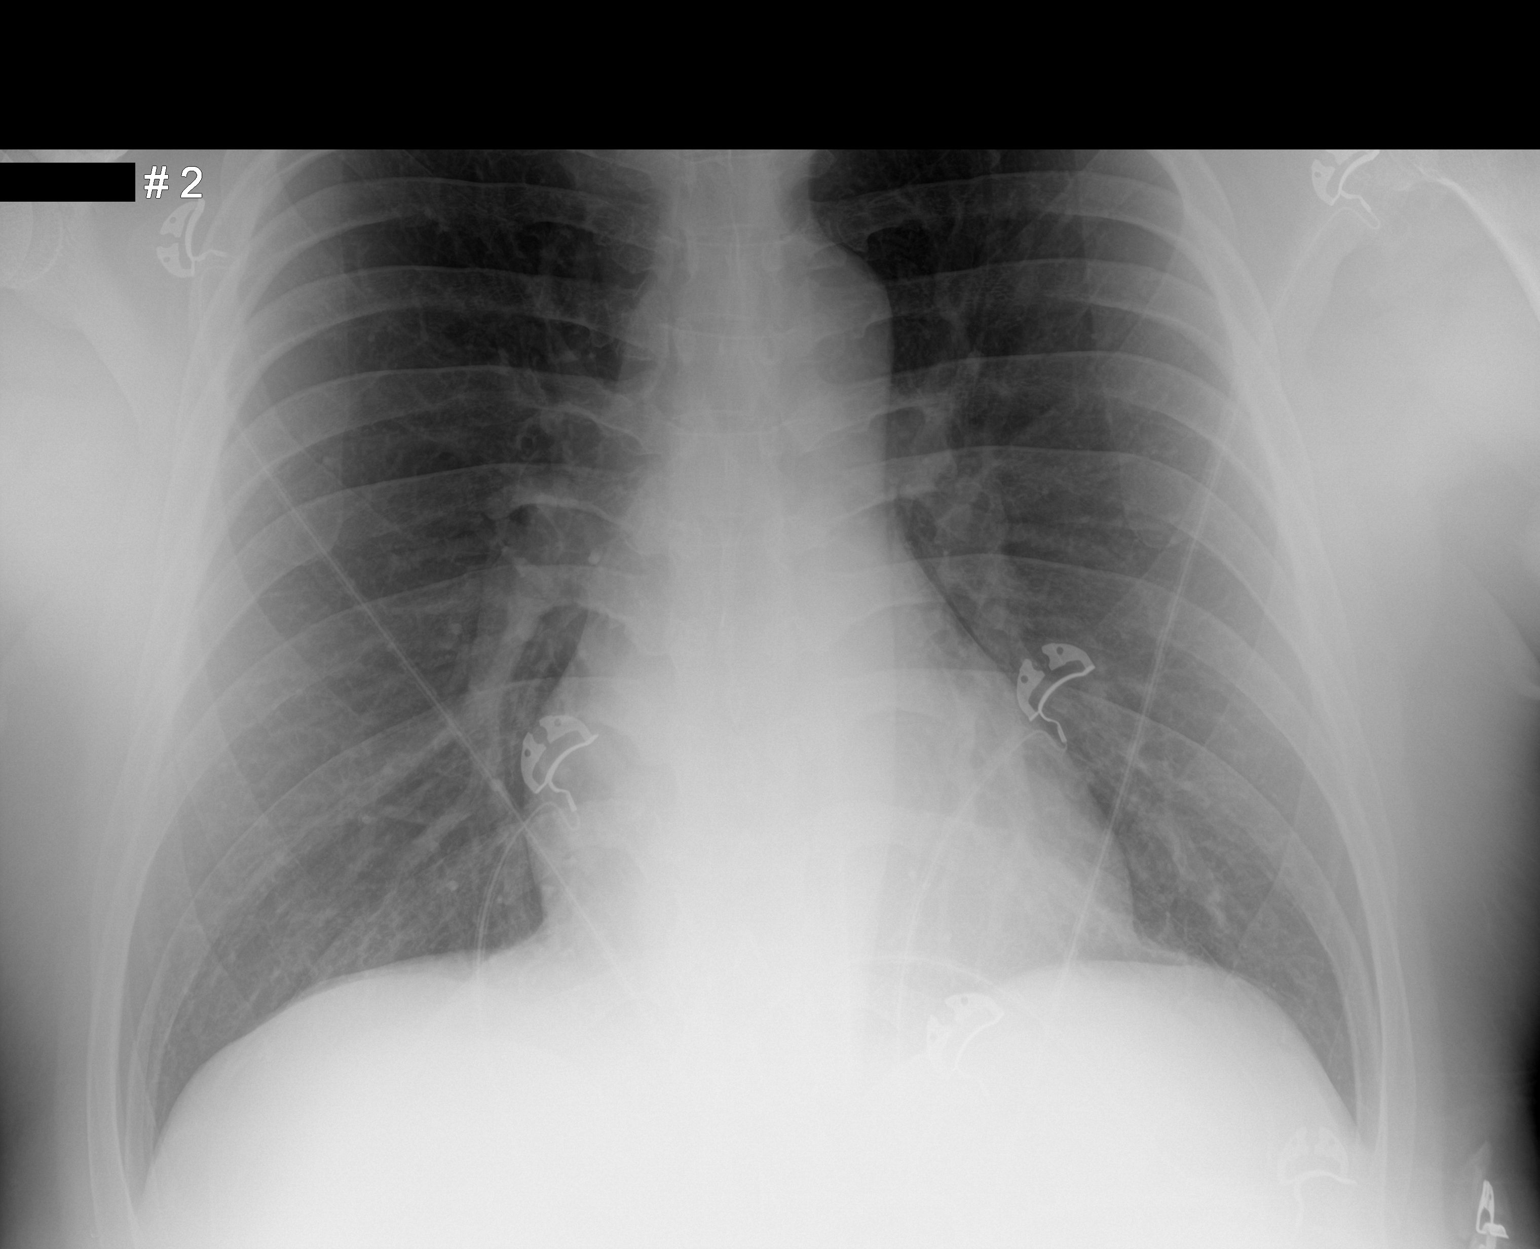

[2 of 2 positions shown; findings below may reference images not displayed]

FINDINGS: The heart size and mediastinal contours are within normal limits.
Both lungs are clear. The visualized skeletal structures are
unremarkable.
IMPRESSION: No active disease.

## 2020-10-16 ENCOUNTER — Other Ambulatory Visit (HOSPITAL_BASED_OUTPATIENT_CLINIC_OR_DEPARTMENT_OTHER): Payer: Self-pay

## 2020-10-16 ENCOUNTER — Other Ambulatory Visit: Payer: Self-pay

## 2020-10-16 ENCOUNTER — Encounter (HOSPITAL_BASED_OUTPATIENT_CLINIC_OR_DEPARTMENT_OTHER): Payer: Self-pay | Admitting: *Deleted

## 2020-10-16 ENCOUNTER — Emergency Department (HOSPITAL_BASED_OUTPATIENT_CLINIC_OR_DEPARTMENT_OTHER)
Admission: EM | Admit: 2020-10-16 | Discharge: 2020-10-16 | Disposition: A | Discharge status: Home / Self Care | Payer: Medicaid Other | Attending: Emergency Medicine | Admitting: Emergency Medicine

## 2020-10-16 DIAGNOSIS — M5442 Lumbago with sciatica, left side: Secondary | ICD-10-CM | POA: Diagnosis not present

## 2020-10-16 DIAGNOSIS — Z87891 Personal history of nicotine dependence: Secondary | ICD-10-CM | POA: Diagnosis not present

## 2020-10-16 DIAGNOSIS — J45909 Unspecified asthma, uncomplicated: Secondary | ICD-10-CM | POA: Insufficient documentation

## 2020-10-16 DIAGNOSIS — Z7951 Long term (current) use of inhaled steroids: Secondary | ICD-10-CM | POA: Diagnosis not present

## 2020-10-16 DIAGNOSIS — M545 Low back pain, unspecified: Secondary | ICD-10-CM | POA: Diagnosis present

## 2020-10-16 DIAGNOSIS — R202 Paresthesia of skin: Secondary | ICD-10-CM | POA: Insufficient documentation

## 2020-10-16 MED ORDER — KETOROLAC TROMETHAMINE 30 MG/ML IJ SOLN
30.0000 mg | Freq: Once | INTRAMUSCULAR | Status: AC
  Administered 2020-10-16: 13:00:00 30 mg via INTRAMUSCULAR
  Filled 2020-10-16: qty 1

## 2020-10-16 MED ORDER — CYCLOBENZAPRINE HCL 10 MG PO TABS
10.0000 mg | ORAL_TABLET | Freq: Every evening | ORAL | 0 refills | Status: AC | PRN
Start: 2020-10-16 — End: ?
  Filled 2020-10-16: qty 10, 10d supply, fill #0

## 2020-10-16 NOTE — Discharge Instructions (Addendum)
I recommend a combination of tylenol and ibuprofen for management of your pain. You can take a low dose of both at the same time. I recommend 500 mg of Tylenol combined with 600 mg of ibuprofen. This is one maximum strength Tylenol and three regular ibuprofen. You can take these 2-3 times for day for your pain. Please try to take these medications with a small amount of food as well to prevent upsetting your stomach.  Also, please consider topical pain relieving creams such as Voltaran Gel, BioFreeze, or Icy Hot. There is also a pain relieving cream made by [employer]. You should be able to find all of these at your local pharmacy.   I am prescribing you a strong muscle relaxer called flexeril. Please only take this medication once in the evening with dinner. This medication can make you quite drowsy. Do not mix it with alcohol. Do not drive a vehicle after taking it.   Please return to the emergency department if you develop any new or worsening symptoms.  It was a pleasure to meet you.

## 2020-10-16 NOTE — ED Triage Notes (Signed)
Lower back pain x 4 days. Now the pain in radiating down his left him.

## 2020-10-16 NOTE — ED Provider Notes (Signed)
[facility] EMERGENCY DEPARTMENT Provider Note   CSN: [id number] Arrival date & time: 10/16/20  1049     History Chief Complaint  Patient presents with   Back Pain    Matthew Cooley is a 30 y.o. male.  HPI Patient is a 30 year old male with a history of anxiety and asthma who presents to the emergency department with left low back pain.  Symptoms started about 4 days ago.  They began worsening over the past couple of days.  He notes pain radiating down the left posterior leg.  Worsens with ambulation as well as sitting.  He is currently taking prednisone for an asthma exacerbation last week.  Denies any weakness.  Reports intermittent tingling in the leg.  No bowel or bladder incontinence.  No other complaints.    Past Medical History:  Diagnosis Date   Anxiety    Asthma     Patient Active Problem List   Diagnosis Date Noted   Dental abscess 02/28/2017   Asthma    Facial swelling     Past Surgical History:  Procedure Laterality Date   HAND SURGERY     JOINT REPLACEMENT     MOUTH SURGERY         No family history on file.  Social History   Tobacco Use   Smoking status: Former    Years: 5.00    Pack years: 0.00    Types: Cigarettes   Smokeless tobacco: Never  Vaping Use   Vaping Use: Never used  Substance Use Topics   Alcohol use: Yes    Comment: occasional   Drug use: Yes    Types: Marijuana    Home Medications Prior to Admission medications   Medication Sig Start Date End Date Taking? Authorizing Provider  albuterol (PROVENTIL HFA;VENTOLIN HFA) 108 (90 Base) MCG/ACT inhaler Inhale 1-2 puffs into the lungs every 6 (six) hours as needed for wheezing or shortness of breath.   Yes [provider]  cyclobenzaprine (FLEXERIL) 10 MG tablet Take 1 tablet (10 mg total) by mouth at bedtime as needed for muscle spasms. 10/16/20  Yes Placido Sou, PA-C  albuterol (PROVENTIL) (5 MG/ML) 0.5% nebulizer solution Take 2.5 mg by nebulization  every 6 (six) hours as needed for wheezing or shortness of breath.    [provider]  Fluticasone-Salmeterol (ADVAIR) 100-50 MCG/DOSE AEPB Inhale 1 puff into the lungs every 12 (twelve) hours.      [provider]    Allergies    Diphenhydramine hcl  Review of Systems   Review of Systems  Musculoskeletal:  Positive for back pain and myalgias.  Neurological:  Negative for weakness and numbness.   Physical Exam Updated Vital Signs BP 127/89 (BP Location: Left Wrist)   Pulse 80   Temp 98.4 F (36.9 C) (Oral)   Resp 20   Ht 5\' 10"  (1.778 m)   Wt 104.3 kg   SpO2 99%   BMI 33.00 kg/m   Physical Exam Vitals and nursing note reviewed.  Constitutional:      General: He is not in acute distress.    Appearance: He is well-developed.  HENT:     Head: Normocephalic and atraumatic.     Right Ear: External ear normal.     Left Ear: External ear normal.  Eyes:     General: No scleral icterus.       Right eye: No discharge.        Left eye: No discharge.     Conjunctiva/sclera:  Conjunctivae normal.  Neck:     Trachea: No tracheal deviation.  Cardiovascular:     Rate and Rhythm: Normal rate.  Pulmonary:     Effort: Pulmonary effort is normal. No respiratory distress.     Breath sounds: No stridor.  Abdominal:     General: There is no distension.  Musculoskeletal:        General: Tenderness present. No swelling or deformity.     Cervical back: Neck supple.     Comments: Moderate TTP noted along the left gluteal region.  Positive straight leg raise on the left.  No midline spine pain.  Skin:    General: Skin is warm and dry.     Findings: No rash.  Neurological:     General: No focal deficit present.     Mental Status: He is alert and oriented to person, place, and time.     Cranial Nerves: Cranial nerve deficit: no gross deficits.     Comments: Strength is 5/5 with plantar/dorsi flexion of the bilateral feet.  Distal sensation intact.  2+ DP pulses.   ED  Results / Procedures / Treatments   Labs (all labs ordered are listed, but only abnormal results are displayed) Labs Reviewed - No data to display  EKG None  Radiology No results found.  Procedures Procedures   Medications Ordered in ED Medications  ketorolac (TORADOL) 30 MG/ML injection 30 mg (has no administration in time range)    ED Course  I have reviewed the triage vital signs and the nursing notes.  Pertinent labs & imaging results that were available during my care of the patient were reviewed by me and considered in my medical decision making (see chart for details).    MDM Rules/Calculators/A&P                          Patient is a 30 year old male who presents to the emergency department due to what appears to be symptoms related to sciatica on the left side.  Patient has palpable pain in the gluteal region.  Positive straight leg raise.  No midline spine pain.  Neurovascularly intact in the left leg.  No bowel or bladder incontinence.  No red flags.  Doubt cauda equina at this time.  Patient is currently taking prednisone for asthma.  Recommended that he continue taking this.  We will give IM Toradol here in the emergency department and discharge on a course of Flexeril.  We discussed safety regarding this medication.  Recommended continued use of Tylenol/ibuprofen for his pain.  We discussed dosing.  Feel the patient is stable for discharge at this time and he is agreeable.  Given strict return precautions.  His questions were answered and he was amicable at the time of discharge.  Final Clinical Impression(s) / ED Diagnoses Final diagnoses:  Acute left-sided low back pain with left-sided sciatica    Rx / DC Orders ED Discharge Orders          Ordered    cyclobenzaprine (FLEXERIL) 10 MG tablet  At bedtime PRN        10/16/20 1231             Placido Sou, PA-C 10/16/20 1235    Little, Ambrose Finland, MD 10/16/20 1331

## 2020-12-31 ENCOUNTER — Other Ambulatory Visit: Payer: Self-pay

## 2020-12-31 ENCOUNTER — Encounter (HOSPITAL_BASED_OUTPATIENT_CLINIC_OR_DEPARTMENT_OTHER): Payer: Self-pay | Admitting: *Deleted

## 2020-12-31 ENCOUNTER — Emergency Department (HOSPITAL_BASED_OUTPATIENT_CLINIC_OR_DEPARTMENT_OTHER)
Admission: EM | Admit: 2020-12-31 | Discharge: 2020-12-31 | Disposition: A | Discharge status: Home / Self Care | Payer: Medicaid Other | Attending: Emergency Medicine | Admitting: Emergency Medicine

## 2020-12-31 DIAGNOSIS — Z87891 Personal history of nicotine dependence: Secondary | ICD-10-CM | POA: Diagnosis not present

## 2020-12-31 DIAGNOSIS — R0602 Shortness of breath: Secondary | ICD-10-CM | POA: Diagnosis not present

## 2020-12-31 DIAGNOSIS — R059 Cough, unspecified: Secondary | ICD-10-CM | POA: Diagnosis not present

## 2020-12-31 DIAGNOSIS — J45909 Unspecified asthma, uncomplicated: Secondary | ICD-10-CM | POA: Insufficient documentation

## 2020-12-31 DIAGNOSIS — M546 Pain in thoracic spine: Secondary | ICD-10-CM | POA: Diagnosis not present

## 2020-12-31 DIAGNOSIS — M545 Low back pain, unspecified: Secondary | ICD-10-CM | POA: Diagnosis not present

## 2020-12-31 MED ORDER — KETOROLAC TROMETHAMINE 30 MG/ML IJ SOLN
30.0000 mg | Freq: Once | INTRAMUSCULAR | Status: DC
  Filled 2020-12-31: qty 1

## 2020-12-31 MED ORDER — METHOCARBAMOL 500 MG PO TABS: 500.0000 mg | ORAL_TABLET | Freq: Two times a day (BID) | ORAL | 0 refills | Status: AC

## 2020-12-31 MED ORDER — KETOROLAC TROMETHAMINE 30 MG/ML IJ SOLN
30.0000 mg | Freq: Once | INTRAMUSCULAR | Status: AC
  Administered 2020-12-31: 30 mg via INTRAMUSCULAR

## 2020-12-31 NOTE — Discharge Instructions (Addendum)
We gave you an injection of Toradol today which should help bring down the inflammation in your back. I'm writing you a prescription for a different muscle relaxer that shouldn't make you feel as sleepy as the previous one. You can take 2 times daily.   Follow up with your primary doctor to discuss options for managing your back pain long term. You could consider PT or steroid injections for persistent symptoms.   Return to the ED for new or worsening symptoms such as weakness, numbness, or difficulty walking.

## 2020-12-31 NOTE — ED Provider Notes (Signed)
[facility] EMERGENCY DEPARTMENT Provider Note   CSN: [id number] Arrival date & time: 12/31/20  0751     History Chief Complaint  Patient presents with   Back Pain    Matthew Cooley is a 30 y.o. male with history of asthma presents with 3 days of back pain. He has history of low back pain and sciatica and reports this pain feels different. His pain is localized to the lower thoracic/upper lumbar region and radiates bilaterally to his abdomen. Pain is made worse with coughing, deep breathing, and movement. He denies recent injury or illness. He has tried taking aleve without relief. He denies weakness, numbness, loss of bladder or bowel function.   Back Pain Associated symptoms: no abdominal pain, no chest pain, no dysuria and no weakness       Past Medical History:  Diagnosis Date   Anxiety    Asthma     Patient Active Problem List   Diagnosis Date Noted   Dental abscess 02/28/2017   Asthma    Facial swelling     Past Surgical History:  Procedure Laterality Date   HAND SURGERY     JOINT REPLACEMENT     MOUTH SURGERY         History reviewed. No pertinent family history.  Social History   Tobacco Use   Smoking status: Former    Years: 5.00    Types: Cigarettes   Smokeless tobacco: Never  Vaping Use   Vaping Use: Never used  Substance Use Topics   Alcohol use: Yes    Comment: occasional   Drug use: Yes    Types: Marijuana    Home Medications Prior to Admission medications   Medication Sig Start Date End Date Taking? Authorizing Provider  albuterol (PROVENTIL HFA;VENTOLIN HFA) 108 (90 Base) MCG/ACT inhaler Inhale 1-2 puffs into the lungs every 6 (six) hours as needed for wheezing or shortness of breath.   Yes [provider]  cyclobenzaprine (FLEXERIL) 10 MG tablet Take 1 tablet (10 mg total) by mouth at bedtime as needed for muscle spasms. 10/16/20  Yes Placido Sou, PA-C  methocarbamol (ROBAXIN) 500 MG tablet Take 1 tablet (500  mg total) by mouth 2 (two) times daily. 12/31/20  Yes Susa Bones T, PA-C  albuterol (PROVENTIL) (5 MG/ML) 0.5% nebulizer solution Take 2.5 mg by nebulization every 6 (six) hours as needed for wheezing or shortness of breath.    [provider]  Fluticasone-Salmeterol (ADVAIR) 100-50 MCG/DOSE AEPB Inhale 1 puff into the lungs every 12 (twelve) hours.      [provider]    Allergies    Benadryl [diphenhydramine] and Diphenhydramine hcl  Review of Systems   Review of Systems  Respiratory:  Positive for cough and shortness of breath.        Difficulty with deep breath due to pain  Cardiovascular:  Negative for chest pain.  Gastrointestinal:  Negative for abdominal pain.  Genitourinary:  Negative for difficulty urinating, dysuria and urgency.  Musculoskeletal:  Positive for back pain. Negative for neck pain.  Neurological:  Negative for weakness.  All other systems reviewed and are negative.  Physical Exam Updated Vital Signs BP 123/86 (BP Location: Right Arm)   Pulse 78   Temp 98.2 F (36.8 C) (Oral)   Resp (!) 24   Ht 5\' 10"  (1.778 m) Comment: Simultaneous filing. User may not have seen previous data.  Wt 106.6 kg Comment: Simultaneous filing. User may not have seen previous data.  SpO2 100%  BMI 33.72 kg/m   Physical Exam Vitals and nursing note reviewed.  Constitutional:      Appearance: Normal appearance.  HENT:     Head: Normocephalic and atraumatic.  Eyes:     Conjunctiva/sclera: Conjunctivae normal.  Pulmonary:     Effort: Pulmonary effort is normal. No respiratory distress.  Abdominal:     General: There is no distension.     Palpations: Abdomen is soft.     Tenderness: There is no abdominal tenderness.  Musculoskeletal:        General: Normal range of motion.     Comments: Generalized tenderness to lumbar spine, no bony tenderness. Negative straight leg raise bilaterally. 5/5 strength of bilateral lower extremities. Sensation in tact  bilaterally.   Skin:    General: Skin is warm and dry.  Neurological:     General: No focal deficit present.     Mental Status: He is alert and oriented to person, place, and time.    ED Results / Procedures / Treatments   Labs (all labs ordered are listed, but only abnormal results are displayed) Labs Reviewed - No data to display  EKG None  Radiology No results found.  Procedures Procedures   Medications Ordered in ED Medications  ketorolac (TORADOL) 30 MG/ML injection 30 mg (30 mg Intramuscular Given 12/31/20 1740)    ED Course  I have reviewed the triage vital signs and the nursing notes.  Pertinent labs & imaging results that were available during my care of the patient were reviewed by me and considered in my medical decision making (see chart for details).    MDM Rules/Calculators/A&P                           Patient is 30 y/o male who presents with 3 days of lower back pain. He denies recent injury or illness. Pain worse with deep breathing and movement. Denies numbness, weakness, loss of bladder or bowel function. On exam patient has generalized tenderness to palpation of lumbar spine. No bony tenderness. Strength 5/5 in all extremities and sensation in tact. Negative straight leg raise.   Gave patient IM toradol and prescription for robaxin. No imaging needed at this time due to the gradual progression of symptoms not likely an acute pathology, and no red flag symptoms. Discussed following up with PCP to discuss long term management of his back pain and potential referral to PT or spine specialist. Patient is stable for discharge and agreeable to plan. Discussed returning to ED for new or worsening symptoms.   Final Clinical Impression(s) / ED Diagnoses Final diagnoses:  Acute bilateral low back pain without sciatica    Rx / DC Orders ED Discharge Orders          Ordered    methocarbamol (ROBAXIN) 500 MG tablet  2 times daily        12/31/20 0835              Varie Machamer T, PA-C 12/31/20 0914    Long, Arlyss Repress, MD 12/31/20 [phone]

## 2020-12-31 NOTE — ED Notes (Signed)
ED Provider at bedside. 

## 2020-12-31 NOTE — ED Triage Notes (Signed)
Here for back pain, states onset approx 3 days ago, denies any injury or straining to back, has hx of sciatica, states coughing makes pain worse, denies any fever or urological issues.

## 2021-03-21 ENCOUNTER — Other Ambulatory Visit: Payer: Self-pay

## 2022-02-28 ENCOUNTER — Other Ambulatory Visit: Payer: Self-pay

## 2023-07-28 ENCOUNTER — Emergency Department (HOSPITAL_BASED_OUTPATIENT_CLINIC_OR_DEPARTMENT_OTHER)
Admission: EM | Admit: 2023-07-28 | Discharge: 2023-07-28 | Disposition: A | Discharge status: Home / Self Care | Attending: Emergency Medicine | Admitting: Emergency Medicine

## 2023-07-28 ENCOUNTER — Emergency Department (HOSPITAL_BASED_OUTPATIENT_CLINIC_OR_DEPARTMENT_OTHER)

## 2023-07-28 ENCOUNTER — Other Ambulatory Visit: Payer: Self-pay

## 2023-07-28 ENCOUNTER — Encounter (HOSPITAL_BASED_OUTPATIENT_CLINIC_OR_DEPARTMENT_OTHER): Payer: Self-pay | Admitting: Emergency Medicine

## 2023-07-28 DIAGNOSIS — J45909 Unspecified asthma, uncomplicated: Secondary | ICD-10-CM | POA: Diagnosis not present

## 2023-07-28 DIAGNOSIS — G510 Bell's palsy: Secondary | ICD-10-CM | POA: Insufficient documentation

## 2023-07-28 DIAGNOSIS — R29810 Facial weakness: Secondary | ICD-10-CM | POA: Diagnosis present

## 2023-07-28 DIAGNOSIS — Z79899 Other long term (current) drug therapy: Secondary | ICD-10-CM | POA: Insufficient documentation

## 2023-07-28 LAB — CBC
HCT: 42.1 % (ref 39.0–52.0)
Hemoglobin: 14.7 g/dL (ref 13.0–17.0)
MCH: 28.7 pg (ref 26.0–34.0)
MCHC: 34.9 g/dL (ref 30.0–36.0)
MCV: 82.2 fL (ref 80.0–100.0)
Platelets: 244 10*3/uL (ref 150–400)
RBC: 5.12 MIL/uL (ref 4.22–5.81)
RDW: 14.6 % (ref 11.5–15.5)
WBC: 7.3 10*3/uL (ref 4.0–10.5)
nRBC: 0 % (ref 0.0–0.2)

## 2023-07-28 LAB — COMPREHENSIVE METABOLIC PANEL
ALT: 15 U/L (ref 0–44)
AST: 20 U/L (ref 15–41)
Albumin: 4.1 g/dL (ref 3.5–5.0)
Alkaline Phosphatase: 45 U/L (ref 38–126)
Anion gap: 10 (ref 5–15)
BUN: 11 mg/dL (ref 6–20)
CO2: 20 mmol/L — ABNORMAL LOW (ref 22–32)
Calcium: 9 mg/dL (ref 8.9–10.3)
Chloride: 107 mmol/L (ref 98–111)
Creatinine, Ser: 1 mg/dL (ref 0.61–1.24)
GFR, Estimated: >60 mL/min (ref >60)
Glucose, Bld: 140 mg/dL — ABNORMAL HIGH (ref 70–99)
Potassium: 3.6 mmol/L (ref 3.5–5.1)
Sodium: 137 mmol/L (ref 135–145)
Total Bilirubin: 0.7 mg/dL (ref 0.0–1.2)
Total Protein: 7.4 g/dL (ref 6.5–8.1)

## 2023-07-28 LAB — DIFFERENTIAL
Abs Immature Granulocytes: 0.04 10*3/uL (ref 0.00–0.07)
Basophils Absolute: 0.1 10*3/uL (ref 0.0–0.1)
Basophils Relative: 1 %
Eosinophils Absolute: 0.6 10*3/uL — ABNORMAL HIGH (ref 0.0–0.5)
Eosinophils Relative: 8 %
Immature Granulocytes: 1 %
Lymphocytes Relative: 23 %
Lymphs Abs: 1.7 10*3/uL (ref 0.7–4.0)
Monocytes Absolute: 0.5 10*3/uL (ref 0.1–1.0)
Monocytes Relative: 6 %
Neutro Abs: 4.5 10*3/uL (ref 1.7–7.7)
Neutrophils Relative %: 61 %

## 2023-07-28 LAB — PROTIME-INR
INR: 1.2 (ref 0.8–1.2)
Prothrombin Time: 15.4 s — ABNORMAL HIGH (ref 11.4–15.2)

## 2023-07-28 LAB — ETHANOL: Alcohol, Ethyl (B): <10 mg/dL (ref <10)

## 2023-07-28 LAB — CBG MONITORING, ED: Glucose-Capillary: 129 mg/dL — ABNORMAL HIGH (ref 70–99)

## 2023-07-28 LAB — APTT: aPTT: 33 s (ref 24–36)

## 2023-07-28 MED ORDER — VALACYCLOVIR HCL 1 G PO TABS: 1000.0000 mg | ORAL_TABLET | Freq: Three times a day (TID) | ORAL | 0 refills | Status: AC

## 2023-07-28 MED ORDER — PREDNISONE 20 MG PO TABS: ORAL_TABLET | ORAL | 0 refills | Status: AC

## 2023-07-28 MED ORDER — ARTIFICIAL TEARS OPHTHALMIC OINT: TOPICAL_OINTMENT | Freq: Every day | OPHTHALMIC | 0 refills | Status: AC

## 2023-07-28 MED ORDER — VALACYCLOVIR HCL 500 MG PO TABS
1000.0000 mg | ORAL_TABLET | Freq: Once | ORAL | Status: AC
  Administered 2023-07-28: 1000 mg via ORAL
  Filled 2023-07-28: qty 2

## 2023-07-28 MED ORDER — PREDNISONE 50 MG PO TABS
60.0000 mg | ORAL_TABLET | Freq: Once | ORAL | Status: AC
  Administered 2023-07-28: 60 mg via ORAL
  Filled 2023-07-28: qty 1

## 2023-07-28 MED ORDER — SODIUM CHLORIDE 0.9% FLUSH
3.0000 mL | Freq: Once | INTRAVENOUS | Status: DC
  Filled 2023-07-28: qty 3

## 2023-07-28 NOTE — Discharge Instructions (Addendum)
 Your facial symptoms appear to be from Bell's palsy.  We are treating you with steroids as well as antiviral medicines.  Be sure to complete the course of both.  Follow-up with the ENT specialist in the next 1-2 weeks as well as with your primary care provider.  You were given the first dose of steroids (prednisone) today, start the next dose tomorrow. You were also given the first dose of Valtrex (valacyclovir) today, take the next dose before bed.  If you develop new or worsening headache, weakness or numbness in your arms or legs, slurred speech or trouble speaking, vision abnormalities, or any other new/concerning symptoms then return to the ER or call 911.

## 2023-07-28 NOTE — ED Provider Notes (Signed)
 [place] EMERGENCY DEPARTMENT AT [facility] Provider Note   CSN: [id number] Arrival date & time: 07/28/23  1444     History  Chief Complaint  Patient presents with   Facial Droop    Matthew Cooley is a 33 y.o. male.  HPI 33 year old male with a history of asthma presents with left-sided facial numbness and twitching.  He is also had an on-and-off headache.  Symptoms started on 3/22 in the evening.  He first noticed the twitching sensation.  No fever or extremity weakness or numbness.  No dental pain or swelling.  No oral swelling.  No visual complaints. No current headache.  Home Medications Prior to Admission medications   Medication Sig Start Date End Date Taking? Authorizing Provider  albuterol (PROVENTIL HFA;VENTOLIN HFA) 108 (90 Base) MCG/ACT inhaler Inhale 1-2 puffs into the lungs every 6 (six) hours as needed for wheezing or shortness of breath.    [provider]  albuterol (PROVENTIL) (5 MG/ML) 0.5% nebulizer solution Take 2.5 mg by nebulization every 6 (six) hours as needed for wheezing or shortness of breath.    [provider]  cyclobenzaprine (FLEXERIL) 10 MG tablet Take 1 tablet (10 mg total) by mouth at bedtime as needed for muscle spasms. 10/16/20   Placido Sou, PA-C  Fluticasone-Salmeterol (ADVAIR) 100-50 MCG/DOSE AEPB Inhale 1 puff into the lungs every 12 (twelve) hours.      [provider]  methocarbamol (ROBAXIN) 500 MG tablet Take 1 tablet (500 mg total) by mouth 2 (two) times daily. 12/31/20   Roemhildt, Lorin T, PA-C      Allergies    Benadryl [diphenhydramine] and Diphenhydramine hcl    Review of Systems   Review of Systems  Constitutional:  Negative for fever.  HENT:  Negative for ear pain.   Neurological:  Positive for numbness and headaches. Negative for speech difficulty.    Physical Exam Updated Vital Signs BP (!) 131/90   Pulse 81   Temp 97.8 F (36.6 C)   Resp 19   Ht 5\' 5"  (1.651 m)   Wt  90.7 kg   SpO2 100%   BMI 33.28 kg/m  Physical Exam Vitals and nursing note reviewed.  Constitutional:      Appearance: He is well-developed.  HENT:     Head: Normocephalic and atraumatic.     Right Ear: Tympanic membrane normal.     Left Ear: Tympanic membrane normal.     Mouth/Throat:     Comments: No oropharyngeal swelling.  No obvious dental infection or abscess Eyes:     Extraocular Movements: Extraocular movements intact.     Pupils: Pupils are equal, round, and reactive to light.  Cardiovascular:     Rate and Rhythm: Normal rate and regular rhythm.     Heart sounds: Normal heart sounds.  Pulmonary:     Effort: Pulmonary effort is normal.     Breath sounds: Normal breath sounds.  Abdominal:     General: There is no distension.  Musculoskeletal:     Cervical back: No rigidity.  Skin:    General: Skin is warm and dry.  Neurological:     Mental Status: He is alert.     Comments: Patient has left-sided cranial nerve VII palsy.  He has clear speech.  5/5 strength in all 4 extremities and grossly normal sensation in all 4 extremities.  Normal finger-to-nose bilaterally.     ED Results / Procedures / Treatments   Labs (all labs ordered are listed, but  only abnormal results are displayed) Labs Reviewed  PROTIME-INR - Abnormal; Notable for the following components:      Result Value   Prothrombin Time 15.4 (*)    All other components within normal limits  DIFFERENTIAL - Abnormal; Notable for the following components:   Eosinophils Absolute 0.6 (*)    All other components within normal limits  CBG MONITORING, ED - Abnormal; Notable for the following components:   Glucose-Capillary 129 (*)    All other components within normal limits  APTT  ETHANOL  CBC  COMPREHENSIVE METABOLIC PANEL    EKG EKG Interpretation Date/Time:  Monday July 28 2023 14:56:37 EDT Ventricular Rate:  80 PR Interval:  178 QRS Duration:  105 QT Interval:  359 QTC Calculation: 415 R  Axis:   99  Text Interpretation: Sinus rhythm  ST changes likely early repol, uchanged when compared to 2021 Confirmed by Pricilla Loveless [phone]) on 07/28/2023 3:03:58 PM  Radiology No results found.  Procedures Procedures    Medications Ordered in ED Medications  sodium chloride flush (NS) 0.9 % injection 3 mL (0 mLs Intravenous Hold 07/28/23 1528)  predniSONE (DELTASONE) tablet 60 mg (60 mg Oral Given 07/28/23 1533)  valACYclovir (VALTREX) tablet 1,000 mg (1,000 mg Oral Given 07/28/23 1532)    ED Course/ Medical Decision Making/ A&P                                 Medical Decision Making Amount and/or Complexity of Data Reviewed External Data Reviewed: notes. Labs: ordered.    Details: Mild hyperglycemia at 129.  Normal WBC. ECG/medicine tests: independent interpretation performed.    Details: Early repolarization, no ischemia  Risk Prescription drug management.   Presentation is consistent with Bell's palsy.  Labs ordered from triage are relatively unremarkable.  Normal WBC.  No obvious infection on exam.  No recent tick bite per patient report.  Doubt Lyme disease.  He has had an on-and-off headache but I suspect this is from the syndrome itself as he otherwise has a benign neuroexam besides the cranial nerve VII palsy.  Discussed potentially getting a head CT which had been ordered from triage and through shared decision making we decided to hold off.  Will treat with steroids, Lacri-Lube, valacyclovir.  Will refer to ENT as an outpatient.        Final Clinical Impression(s) / ED Diagnoses Final diagnoses:  Left-sided Bell palsy    Rx / DC Orders ED Discharge Orders     None         Pricilla Loveless, MD 07/28/23 [phone]

## 2023-07-28 NOTE — ED Triage Notes (Addendum)
 Pt POV c/o L side of face twitching since Saturday night, L side mouth numb, progressively worsening. Also reports feeling "off" and sluggish.   Denies recent dental work . EDP Matthew Cooley made aware.  KKW Saturday 1900.

## 2023-08-06 ENCOUNTER — Encounter (HOSPITAL_BASED_OUTPATIENT_CLINIC_OR_DEPARTMENT_OTHER): Payer: Self-pay

## 2023-08-06 ENCOUNTER — Emergency Department (HOSPITAL_BASED_OUTPATIENT_CLINIC_OR_DEPARTMENT_OTHER)
Admission: EM | Admit: 2023-08-06 | Discharge: 2023-08-06 | Discharge status: Against Medical Advice | Attending: Emergency Medicine | Admitting: Emergency Medicine

## 2023-08-06 ENCOUNTER — Emergency Department (HOSPITAL_BASED_OUTPATIENT_CLINIC_OR_DEPARTMENT_OTHER)

## 2023-08-06 ENCOUNTER — Other Ambulatory Visit: Payer: Self-pay

## 2023-08-06 DIAGNOSIS — R220 Localized swelling, mass and lump, head: Secondary | ICD-10-CM | POA: Insufficient documentation

## 2023-08-06 DIAGNOSIS — R531 Weakness: Secondary | ICD-10-CM | POA: Insufficient documentation

## 2023-08-06 DIAGNOSIS — Z5329 Procedure and treatment not carried out because of patient's decision for other reasons: Secondary | ICD-10-CM | POA: Insufficient documentation

## 2023-08-06 DIAGNOSIS — R519 Headache, unspecified: Secondary | ICD-10-CM | POA: Insufficient documentation

## 2023-08-06 DIAGNOSIS — R200 Anesthesia of skin: Secondary | ICD-10-CM | POA: Diagnosis not present

## 2023-08-06 DIAGNOSIS — R29810 Facial weakness: Secondary | ICD-10-CM | POA: Diagnosis present

## 2023-08-06 LAB — BASIC METABOLIC PANEL WITH GFR
Anion gap: 8 (ref 5–15)
BUN: 17 mg/dL (ref 6–20)
CO2: 28 mmol/L (ref 22–32)
Calcium: 8.5 mg/dL — ABNORMAL LOW (ref 8.9–10.3)
Chloride: 104 mmol/L (ref 98–111)
Creatinine, Ser: 0.93 mg/dL (ref 0.61–1.24)
GFR, Estimated: >60 mL/min (ref >60)
Glucose, Bld: 82 mg/dL (ref 70–99)
Potassium: 3.2 mmol/L — ABNORMAL LOW (ref 3.5–5.1)
Sodium: 140 mmol/L (ref 135–145)

## 2023-08-06 LAB — CBC WITH DIFFERENTIAL/PLATELET
Abs Immature Granulocytes: 0.06 10*3/uL (ref 0.00–0.07)
Basophils Absolute: 0 10*3/uL (ref 0.0–0.1)
Basophils Relative: 0 %
Eosinophils Absolute: 0.1 10*3/uL (ref 0.0–0.5)
Eosinophils Relative: 1 %
HCT: 42.5 % (ref 39.0–52.0)
Hemoglobin: 14.7 g/dL (ref 13.0–17.0)
Immature Granulocytes: 1 %
Lymphocytes Relative: 31 %
Lymphs Abs: 3.8 10*3/uL (ref 0.7–4.0)
MCH: 28.4 pg (ref 26.0–34.0)
MCHC: 34.6 g/dL (ref 30.0–36.0)
MCV: 82.2 fL (ref 80.0–100.0)
Monocytes Absolute: 1.1 10*3/uL — ABNORMAL HIGH (ref 0.1–1.0)
Monocytes Relative: 9 %
Neutro Abs: 7.3 10*3/uL (ref 1.7–7.7)
Neutrophils Relative %: 58 %
Platelets: 244 10*3/uL (ref 150–400)
RBC: 5.17 MIL/uL (ref 4.22–5.81)
RDW: 15.1 % (ref 11.5–15.5)
WBC: 12.4 10*3/uL — ABNORMAL HIGH (ref 4.0–10.5)
nRBC: 0 % (ref 0.0–0.2)

## 2023-08-06 MED ORDER — KETOROLAC TROMETHAMINE 15 MG/ML IJ SOLN
15.0000 mg | Freq: Once | INTRAMUSCULAR | Status: AC
  Administered 2023-08-06: 15 mg via INTRAVENOUS
  Filled 2023-08-06: qty 1

## 2023-08-06 MED ORDER — PROCHLORPERAZINE EDISYLATE 10 MG/2ML IJ SOLN
10.0000 mg | Freq: Once | INTRAMUSCULAR | Status: AC
  Administered 2023-08-06: 10 mg via INTRAVENOUS
  Filled 2023-08-06: qty 2

## 2023-08-06 MED ORDER — SODIUM CHLORIDE 0.9 % IV BOLUS
1000.0000 mL | Freq: Once | INTRAVENOUS | Status: AC
  Administered 2023-08-06: 1000 mL via INTRAVENOUS

## 2023-08-06 MED ORDER — SODIUM CHLORIDE 0.9 % IV SOLN: INTRAVENOUS | Status: DC

## 2023-08-06 MED ORDER — DEXAMETHASONE SODIUM PHOSPHATE 10 MG/ML IJ SOLN: 10.0000 mg | Freq: Once | INTRAMUSCULAR | Status: DC

## 2023-08-06 MED ORDER — ACETAMINOPHEN 500 MG PO TABS
1000.0000 mg | ORAL_TABLET | Freq: Once | ORAL | Status: AC
  Administered 2023-08-06: 1000 mg via ORAL
  Filled 2023-08-06: qty 2

## 2023-08-06 NOTE — ED Notes (Signed)
 Patient requesting leaving AMA , needs to pick uo his children from school . Dr Karene Fry made aware and is at bedside .

## 2023-08-06 NOTE — ED Triage Notes (Signed)
 States was here 3/24 and diagnosed him with bells palsy. States was told if his symptoms persist to come back for re eval. Facial droop to left side. C/o left mouth pain/numbness. Completed valtrex & taking steroid without relief. Also having pinpoint headaches.

## 2023-08-06 NOTE — Discharge Instructions (Addendum)
 Please return to the nearest emergency department as soon as you are able for completion of your diagnostic workup.  Your workup today was incomplete and you have elected to sign out AGAINST MEDICAL ADVICE.  The risk of signing out AGAINST MEDICAL ADVICE prior to completion of your workup includes significantly increased morbidity and mortality due to potential for a missed diagnosis.  Please as soon as you are able return to the nearest emergency department or this emergency department for continuation of your diagnostic workup.

## 2023-08-06 NOTE — ED Provider Notes (Signed)
 [facility] Provider Note   CSN: [id number] Arrival date & time: 08/06/23  2841     History  Chief Complaint  Patient presents with   Headache    Matthew Cooley is a 33 y.o. male.   Headache Associated symptoms: numbness and weakness      33 year old male presenting to the emergency department with a chief complaint of facial droop, facial pain and swelling.  The patient states that he was seen in the emergency department on 07/28/2023 during which time he was diagnosed with left-sided Bell's palsy.  He did not undergo any CT imaging at that time.  He was started on Valtrex and a 2-week course of steroids.  He is since completed the Valtrex.  He endorses worsening pain and swelling in the left side of his face with loss of motor function of the left face with persistent facial droop.  He states that he has regained some function of his eyelids and is able to blink slightly.  He still has forehead involvement.  He also endorses severe worsening left-sided facial pain.  He has been compliant with his steroid.  He endorses headaches located on the left side of his temple.  He endorses mild numbness in the left side of his face as well.  No focal numbness or weakness of the extremities noted.  No falls or trauma.  Home Medications Prior to Admission medications   Medication Sig Start Date End Date Taking? Authorizing Provider  albuterol (PROVENTIL HFA;VENTOLIN HFA) 108 (90 Base) MCG/ACT inhaler Inhale 1-2 puffs into the lungs every 6 (six) hours as needed for wheezing or shortness of breath.    [provider]  albuterol (PROVENTIL) (5 MG/ML) 0.5% nebulizer solution Take 2.5 mg by nebulization every 6 (six) hours as needed for wheezing or shortness of breath.    [provider]  artificial tears (LACRILUBE) OINT ophthalmic ointment Place into the left eye at bedtime. 07/28/23   Pricilla Loveless, MD  cyclobenzaprine (FLEXERIL) 10  MG tablet Take 1 tablet (10 mg total) by mouth at bedtime as needed for muscle spasms. 10/16/20   Placido Sou, PA-C  Fluticasone-Salmeterol (ADVAIR) 100-50 MCG/DOSE AEPB Inhale 1 puff into the lungs every 12 (twelve) hours.      [provider]  methocarbamol (ROBAXIN) 500 MG tablet Take 1 tablet (500 mg total) by mouth 2 (two) times daily. 12/31/20   Roemhildt, Lorin T, PA-C  predniSONE (DELTASONE) 20 MG tablet 3 tabs po daily x 2 days, then 2 tabs x 3 days, then 1.5 tabs x 3 days, then 1 tab x 3 days, then 0.5 tabs x 3 days 07/28/23   Pricilla Loveless, MD  valACYclovir (VALTREX) 1000 MG tablet Take 1 tablet (1,000 mg total) by mouth 3 (three) times daily. 07/28/23   Pricilla Loveless, MD      Allergies    Benadryl [diphenhydramine] and Diphenhydramine hcl    Review of Systems   Review of Systems  HENT:  Positive for facial swelling.   Neurological:  Positive for weakness, numbness and headaches.  All other systems reviewed and are negative.   Physical Exam Updated Vital Signs BP 115/83 (BP Location: Left Arm)   Pulse 72   Temp 98.6 F (37 C) (Oral)   Resp 18   Ht 5\' 10"  (1.778 m)   Wt 91.6 kg   SpO2 100%   BMI 28.98 kg/m  Physical Exam Vitals and nursing note reviewed.  Constitutional:  General: He is not in acute distress. HENT:     Head: Normocephalic and atraumatic.     Comments: No specific mastoid swelling, warmth or tenderness    Right Ear: Hearing, tympanic membrane, ear canal and external ear normal.     Left Ear: Hearing, tympanic membrane, ear canal and external ear normal.     Ears:     Comments: Tympanic membrane's visualized bilaterally, external ear canal visualized without evidence of vesicles. Eyes:     Conjunctiva/sclera: Conjunctivae normal.     Pupils: Pupils are equal, round, and reactive to light.  Cardiovascular:     Rate and Rhythm: Normal rate and regular rhythm.  Pulmonary:     Effort: Pulmonary effort is normal. No respiratory  distress.  Abdominal:     General: There is no distension.     Tenderness: There is no guarding.  Musculoskeletal:        General: No deformity or signs of injury.     Cervical back: Neck supple.  Skin:    Findings: No lesion or rash.  Neurological:     General: No focal deficit present.     Mental Status: He is alert. Mental status is at baseline.     GCS: GCS eye subscore is 4. GCS verbal subscore is 5. GCS motor subscore is 6.     Comments: Left facial droop present that involves the forehead.  Pupils equal and reactive to light bilaterally.  Extraocular movements intact.  Subjective decrease sensation to light touch on the left side of the face.  5 out of 5 strength in the bilateral upper and lower extremities with intact sensation to light touch bilaterally.     ED Results / Procedures / Treatments   Labs (all labs ordered are listed, but only abnormal results are displayed) Labs Reviewed  CBC WITH DIFFERENTIAL/PLATELET - Abnormal; Notable for the following components:      Result Value   WBC 12.4 (*)    Monocytes Absolute 1.1 (*)    All other components within normal limits  BASIC METABOLIC PANEL WITH GFR - Abnormal; Notable for the following components:   Potassium 3.2 (*)    Calcium 8.5 (*)    All other components within normal limits    EKG None  Radiology No results found.  Procedures Procedures    Medications Ordered in ED Medications  sodium chloride 0.9 % bolus 1,000 mL (0 mLs Intravenous Stopped 08/06/23 1225)    And  0.9 %  sodium chloride infusion (has no administration in time range)  ketorolac (TORADOL) 15 MG/ML injection 15 mg (15 mg Intravenous Given 08/06/23 1023)  prochlorperazine (COMPAZINE) injection 10 mg (10 mg Intravenous Given 08/06/23 1023)  acetaminophen (TYLENOL) tablet 1,000 mg (1,000 mg Oral Given 08/06/23 1023)    ED Course/ Medical Decision Making/ A&P                                 Medical Decision Making Amount and/or Complexity  of Data Reviewed Labs: ordered. Radiology: ordered.  Risk OTC drugs. Prescription drug management.    32 year old male presenting to the emergency department with a chief complaint of facial droop, facial pain and swelling.  The patient states that he was seen in the emergency department on 07/28/2023 during which time he was diagnosed with left-sided Bell's palsy.  He did not undergo any CT imaging at that time.  He was started on Valtrex and  a 2-week course of steroids.  He is since completed the Valtrex.  He endorses worsening pain and swelling in the left side of his face with loss of motor function of the left face with persistent facial droop.  He states that he has regained some function of his eyelids and is able to blink slightly.  He still has forehead involvement.  He also endorses severe worsening left-sided facial pain.  He has been compliant with his steroid.  He endorses headaches located on the left side of his temple.  He endorses mild numbness in the left side of his face as well.  No focal numbness or weakness of the extremities noted.  No falls or trauma.  On arrival, the patient was afebrile, not tachycardic or tachypneic, BP 115/83, saturating high percent on room air.  Physical exam as per above to include Left facial droop present that involves the forehead.  Pupils equal and reactive to light bilaterally.  Extraocular movements intact.  Subjective decrease sensation to light touch on the left side of the face.  5 out of 5 strength in the bilateral upper and lower extremities with intact sensation to light touch bilaterally.  IV access was obtained and the patient was administered a migraine cocktail.  Suspect likely persistent symptoms of Bell's palsy.  Also considered trigeminal neuralgia.  Patient denies any recent tick exposures.  Also considered acoustic neuroma or other CNS tumor causing cranial nerve deficits.  Further evaluation was ordered to include laboratory evaluation  and CT imaging.  Laboratory evaluation revealed mild leukocytosis which is unremarkable in the setting of the patient's outpatient steroid use.  No other significant electrolyte abnormality.  CT imaging had not been obtained when I was informed by medical staff that the patient wished to sign out AMA.  I was informed by nursing staff that the patient needed to leave AGAINST MEDICAL ADVICE.  His labs suggest resulted but he has not yet undergone CT imaging.  I explained to the patient the risk of missed diagnosis which could result in morbidity and mortality.  The patient states that he has to leave because his son is having a reaction at his school.  I strongly encouraged the patient to represent to the nearest emergency department for repeat evaluation as soon as he is able.  The patient endorsed understanding and still requested to sign out AGAINST MEDICAL ADVICE.   Final Clinical Impression(s) / ED Diagnoses Final diagnoses:  Facial droop  Facial pain    Rx / DC Orders ED Discharge Orders     None         Ernie Avena, MD 08/06/23 1254

## 2023-08-12 ENCOUNTER — Other Ambulatory Visit: Payer: Self-pay

## 2023-08-12 ENCOUNTER — Emergency Department (HOSPITAL_BASED_OUTPATIENT_CLINIC_OR_DEPARTMENT_OTHER)
Admission: EM | Admit: 2023-08-12 | Discharge: 2023-08-12 | Discharge status: Against Medical Advice | Source: Home / Self Care
